# Patient Record
Sex: Female | Born: 1970 | Race: Black or African American | Hispanic: No | Marital: Married | Smoking: Current every day smoker
Health system: Southern US, Community
[De-identification: ages and names within clinical notes are randomized; demographics above are authoritative.]

## PROBLEM LIST (undated history)

## (undated) DIAGNOSIS — I219 Acute myocardial infarction, unspecified: Secondary | ICD-10-CM

## (undated) DIAGNOSIS — I10 Essential (primary) hypertension: Secondary | ICD-10-CM

## (undated) DIAGNOSIS — E785 Hyperlipidemia, unspecified: Secondary | ICD-10-CM

## (undated) DIAGNOSIS — I2510 Atherosclerotic heart disease of native coronary artery without angina pectoris: Secondary | ICD-10-CM

## (undated) HISTORY — PX: JOINT REPLACEMENT: SHX530

---

## 2008-01-03 ENCOUNTER — Emergency Department: Payer: Self-pay | Admitting: Emergency Medicine

## 2012-02-29 ENCOUNTER — Emergency Department (HOSPITAL_COMMUNITY): Payer: Self-pay

## 2012-02-29 ENCOUNTER — Emergency Department (HOSPITAL_COMMUNITY)
Admission: EM | Admit: 2012-02-29 | Discharge: 2012-02-29 | Disposition: A | Discharge status: Home / Self Care | Payer: Self-pay | Attending: Emergency Medicine | Admitting: Emergency Medicine

## 2012-02-29 ENCOUNTER — Encounter (HOSPITAL_COMMUNITY): Payer: Self-pay | Admitting: Emergency Medicine

## 2012-02-29 DIAGNOSIS — I252 Old myocardial infarction: Secondary | ICD-10-CM | POA: Insufficient documentation

## 2012-02-29 DIAGNOSIS — I2510 Atherosclerotic heart disease of native coronary artery without angina pectoris: Secondary | ICD-10-CM | POA: Insufficient documentation

## 2012-02-29 DIAGNOSIS — F17200 Nicotine dependence, unspecified, uncomplicated: Secondary | ICD-10-CM | POA: Insufficient documentation

## 2012-02-29 DIAGNOSIS — Y9389 Activity, other specified: Secondary | ICD-10-CM | POA: Insufficient documentation

## 2012-02-29 DIAGNOSIS — Z87828 Personal history of other (healed) physical injury and trauma: Secondary | ICD-10-CM | POA: Insufficient documentation

## 2012-02-29 DIAGNOSIS — X58XXXA Exposure to other specified factors, initial encounter: Secondary | ICD-10-CM | POA: Insufficient documentation

## 2012-02-29 DIAGNOSIS — Z8679 Personal history of other diseases of the circulatory system: Secondary | ICD-10-CM | POA: Insufficient documentation

## 2012-02-29 DIAGNOSIS — S46909A Unspecified injury of unspecified muscle, fascia and tendon at shoulder and upper arm level, unspecified arm, initial encounter: Secondary | ICD-10-CM | POA: Insufficient documentation

## 2012-02-29 DIAGNOSIS — S4992XA Unspecified injury of left shoulder and upper arm, initial encounter: Secondary | ICD-10-CM

## 2012-02-29 DIAGNOSIS — Y929 Unspecified place or not applicable: Secondary | ICD-10-CM | POA: Insufficient documentation

## 2012-02-29 DIAGNOSIS — S4980XA Other specified injuries of shoulder and upper arm, unspecified arm, initial encounter: Secondary | ICD-10-CM | POA: Insufficient documentation

## 2012-02-29 HISTORY — DX: Acute myocardial infarction, unspecified: I21.9

## 2012-02-29 HISTORY — DX: Atherosclerotic heart disease of native coronary artery without angina pectoris: I25.10

## 2012-02-29 MED ORDER — OXYCODONE-ACETAMINOPHEN 5-325 MG PO TABS: 1.0000 | ORAL_TABLET | ORAL | Status: DC | PRN

## 2012-02-29 MED ORDER — OXYCODONE-ACETAMINOPHEN 5-325 MG PO TABS
1.0000 | ORAL_TABLET | Freq: Once | ORAL | Status: AC
  Administered 2012-02-29: 1 via ORAL
  Filled 2012-02-29: qty 1

## 2012-02-29 NOTE — Progress Notes (Signed)
Orthopedic Tech Progress Note Patient Details:  Carrie Winters 15-Dec-1970 [id number]  Ortho Devices Type of Ortho Device: Arm foam sling Ortho Device/Splint Location: LEFT ARM SLING Ortho Device/Splint Interventions: Application   Cammer, Mickie Bail 02/29/2012, 6:04 PM

## 2012-02-29 NOTE — ED Notes (Signed)
Patient transported to X-ray 

## 2012-02-29 NOTE — ED Notes (Signed)
Ortho notified

## 2012-02-29 NOTE — ED Notes (Signed)
Discharge instructions reviewed. Pt verbalized understanding.  

## 2012-02-29 NOTE — ED Notes (Signed)
Pt c/o of left shoulder pain that started after she was playing with her little cousins. She states she took ibuprofen earlier this morning and has gotten no relief. She states she had surgery on her rotator cuff in that shoulder in 1995.
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
## 2012-03-09 ENCOUNTER — Emergency Department: Payer: Self-pay | Admitting: Emergency Medicine

## 2012-03-09 LAB — URINALYSIS, COMPLETE
Nitrite: NEGATIVE
Ph: 6 (ref 4.5–8.0)
Protein: 30

## 2012-03-27 ENCOUNTER — Emergency Department: Payer: Self-pay | Admitting: Emergency Medicine

## 2012-03-27 LAB — URINALYSIS, COMPLETE
Bilirubin,UR: NEGATIVE
Leukocyte Esterase: NEGATIVE
Nitrite: NEGATIVE
Ph: 7 (ref 4.5–8.0)
Protein: NEGATIVE
RBC,UR: 16 /HPF (ref 0–5)

## 2012-03-27 LAB — CBC
Platelet: 365 10*3/uL (ref 150–440)
RDW: 18.6 % — ABNORMAL HIGH (ref 11.5–14.5)
WBC: 20.6 10*3/uL — ABNORMAL HIGH (ref 3.6–11.0)

## 2012-03-28 LAB — COMPREHENSIVE METABOLIC PANEL
Albumin: 3.5 g/dL (ref 3.4–5.0)
Anion Gap: 11 (ref 7–16)
BUN: 12 mg/dL (ref 7–18)
Chloride: 103 mmol/L (ref 98–107)
EGFR (African American): >60
Glucose: 88 mg/dL (ref 65–99)
Osmolality: 277 (ref 275–301)
Potassium: 3 mmol/L — ABNORMAL LOW (ref 3.5–5.1)
Sodium: 139 mmol/L (ref 136–145)
Total Protein: 7.5 g/dL (ref 6.4–8.2)

## 2012-04-05 ENCOUNTER — Emergency Department: Payer: Self-pay | Admitting: Emergency Medicine

## 2013-01-19 ENCOUNTER — Ambulatory Visit: Payer: Self-pay | Admitting: Family Medicine
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
## 2015-05-04 ENCOUNTER — Observation Stay
Admission: EM | Admit: 2015-05-04 | Discharge: 2015-05-04 | Disposition: A | Discharge status: Home / Self Care | Payer: Managed Care, Other (non HMO) | Attending: Specialist | Admitting: Specialist

## 2015-05-04 ENCOUNTER — Emergency Department: Payer: Managed Care, Other (non HMO)

## 2015-05-04 DIAGNOSIS — N920 Excessive and frequent menstruation with regular cycle: Secondary | ICD-10-CM | POA: Insufficient documentation

## 2015-05-04 DIAGNOSIS — Z8249 Family history of ischemic heart disease and other diseases of the circulatory system: Secondary | ICD-10-CM | POA: Diagnosis not present

## 2015-05-04 DIAGNOSIS — Z96612 Presence of left artificial shoulder joint: Secondary | ICD-10-CM | POA: Insufficient documentation

## 2015-05-04 DIAGNOSIS — D649 Anemia, unspecified: Secondary | ICD-10-CM | POA: Diagnosis present

## 2015-05-04 DIAGNOSIS — Z809 Family history of malignant neoplasm, unspecified: Secondary | ICD-10-CM | POA: Insufficient documentation

## 2015-05-04 DIAGNOSIS — I2510 Atherosclerotic heart disease of native coronary artery without angina pectoris: Secondary | ICD-10-CM

## 2015-05-04 DIAGNOSIS — I214 Non-ST elevation (NSTEMI) myocardial infarction: Secondary | ICD-10-CM | POA: Diagnosis present

## 2015-05-04 DIAGNOSIS — D509 Iron deficiency anemia, unspecified: Secondary | ICD-10-CM | POA: Diagnosis not present

## 2015-05-04 DIAGNOSIS — E782 Mixed hyperlipidemia: Secondary | ICD-10-CM | POA: Insufficient documentation

## 2015-05-04 DIAGNOSIS — I252 Old myocardial infarction: Secondary | ICD-10-CM | POA: Diagnosis not present

## 2015-05-04 DIAGNOSIS — F17210 Nicotine dependence, cigarettes, uncomplicated: Secondary | ICD-10-CM | POA: Diagnosis not present

## 2015-05-04 DIAGNOSIS — Z791 Long term (current) use of non-steroidal anti-inflammatories (NSAID): Secondary | ICD-10-CM | POA: Diagnosis not present

## 2015-05-04 DIAGNOSIS — R079 Chest pain, unspecified: Principal | ICD-10-CM | POA: Diagnosis present

## 2015-05-04 DIAGNOSIS — Z8679 Personal history of other diseases of the circulatory system: Secondary | ICD-10-CM | POA: Insufficient documentation

## 2015-05-04 DIAGNOSIS — R748 Abnormal levels of other serum enzymes: Secondary | ICD-10-CM | POA: Insufficient documentation

## 2015-05-04 DIAGNOSIS — J449 Chronic obstructive pulmonary disease, unspecified: Secondary | ICD-10-CM | POA: Diagnosis not present

## 2015-05-04 DIAGNOSIS — I10 Essential (primary) hypertension: Secondary | ICD-10-CM | POA: Diagnosis not present

## 2015-05-04 DIAGNOSIS — R0602 Shortness of breath: Secondary | ICD-10-CM | POA: Diagnosis not present

## 2015-05-04 HISTORY — DX: Essential (primary) hypertension: I10

## 2015-05-04 HISTORY — DX: Hyperlipidemia, unspecified: E78.5

## 2015-05-04 LAB — CBC
HCT: 22.1 % — ABNORMAL LOW (ref 35.0–47.0)
HEMATOCRIT: 24.6 % — AB (ref 35.0–47.0)
HEMOGLOBIN: 7.3 g/dL — AB (ref 12.0–16.0)
HEMOGLOBIN: 6.5 g/dL — AB (ref 12.0–16.0)
MCH: 18.7 pg — ABNORMAL LOW (ref 26.0–34.0)
MCH: 18.8 pg — AB (ref 26.0–34.0)
MCHC: 29.5 g/dL — ABNORMAL LOW (ref 32.0–36.0)
MCHC: 29.4 g/dL — AB (ref 32.0–36.0)
MCV: 63.3 fL — ABNORMAL LOW (ref 80.0–100.0)
MCV: 63.9 fL — ABNORMAL LOW (ref 80.0–100.0)
PLATELETS: 237 10*3/uL (ref 150–440)
Platelets: 247 10*3/uL (ref 150–440)
RBC: 3.88 MIL/uL (ref 3.80–5.20)
RBC: 3.47 MIL/uL — ABNORMAL LOW (ref 3.80–5.20)
RDW: 16.9 % — AB (ref 11.5–14.5)
RDW: 16.6 % — ABNORMAL HIGH (ref 11.5–14.5)
WBC: 14.4 10*3/uL — AB (ref 3.6–11.0)
WBC: 12.7 10*3/uL — ABNORMAL HIGH (ref 3.6–11.0)

## 2015-05-04 LAB — BASIC METABOLIC PANEL
Anion gap: 4 — ABNORMAL LOW (ref 5–15)
BUN: 14 mg/dL (ref 6–20)
CALCIUM: 8.8 mg/dL — AB (ref 8.9–10.3)
CO2: 28 mmol/L (ref 22–32)
CREATININE: 0.61 mg/dL (ref 0.44–1.00)
Chloride: 107 mmol/L (ref 101–111)
GFR calc Af Amer: >60 mL/min (ref >60)
GFR calc non Af Amer: >60 mL/min (ref >60)
GLUCOSE: 100 mg/dL — AB (ref 65–99)
Potassium: 3.6 mmol/L (ref 3.5–5.1)
Sodium: 139 mmol/L (ref 135–145)

## 2015-05-04 LAB — COMPREHENSIVE METABOLIC PANEL
ALBUMIN: 4 g/dL (ref 3.5–5.0)
ALK PHOS: 63 U/L (ref 38–126)
ALT: 12 U/L — ABNORMAL LOW (ref 14–54)
ANION GAP: 8 (ref 5–15)
AST: 15 U/L (ref 15–41)
BUN: 14 mg/dL (ref 6–20)
CALCIUM: 9.2 mg/dL (ref 8.9–10.3)
CHLORIDE: 104 mmol/L (ref 101–111)
CO2: 27 mmol/L (ref 22–32)
Creatinine, Ser: 0.67 mg/dL (ref 0.44–1.00)
GFR calc Af Amer: >60 mL/min (ref >60)
GFR calc non Af Amer: >60 mL/min (ref >60)
GLUCOSE: 123 mg/dL — AB (ref 65–99)
Potassium: 3.4 mmol/L — ABNORMAL LOW (ref 3.5–5.1)
SODIUM: 139 mmol/L (ref 135–145)
Total Bilirubin: 0.5 mg/dL (ref 0.3–1.2)
Total Protein: 7.6 g/dL (ref 6.5–8.1)

## 2015-05-04 LAB — LIPID PANEL
CHOL/HDL RATIO: 5.3 ratio
CHOLESTEROL: 206 mg/dL — AB (ref 0–200)
HDL: 39 mg/dL — AB (ref >40)
LDL Cholesterol: 149 mg/dL — ABNORMAL HIGH (ref 0–99)
TRIGLYCERIDES: 92 mg/dL (ref <150)
VLDL: 18 mg/dL (ref 0–40)

## 2015-05-04 LAB — TROPONIN I
TROPONIN I: 0.6 ng/mL — AB (ref <0.031)
TROPONIN I: 0.58 ng/mL — AB (ref <0.031)
Troponin I: 0.05 ng/mL — ABNORMAL HIGH (ref <0.031)

## 2015-05-04 LAB — IRON AND TIBC
Iron: 9 ug/dL — ABNORMAL LOW (ref 28–170)
SATURATION RATIOS: 2 % — AB (ref 10.4–31.8)
TIBC: 554 ug/dL — AB (ref 250–450)
UIBC: 545 ug/dL

## 2015-05-04 LAB — VITAMIN B12: Vitamin B-12: 278 pg/mL (ref 180–914)

## 2015-05-04 LAB — FOLATE: Folate: 7.4 ng/mL (ref >5.9)

## 2015-05-04 LAB — HEMOGLOBIN AND HEMATOCRIT, BLOOD
HEMATOCRIT: 27.3 % — AB (ref 35.0–47.0)
HEMATOCRIT: 31.6 % — AB (ref 35.0–47.0)
HEMOGLOBIN: 8.2 g/dL — AB (ref 12.0–16.0)
Hemoglobin: 9.7 g/dL — ABNORMAL LOW (ref 12.0–16.0)

## 2015-05-04 LAB — GLUCOSE, CAPILLARY: GLUCOSE-CAPILLARY: 90 mg/dL (ref 65–99)

## 2015-05-04 LAB — PREPARE RBC (CROSSMATCH)

## 2015-05-04 LAB — RETICULOCYTES
RBC.: 3.8 MIL/uL (ref 3.80–5.20)
RETIC CT PCT: 2 % (ref 0.4–3.1)
Retic Count, Absolute: 76 10*3/uL (ref 19.0–183.0)

## 2015-05-04 LAB — ABO/RH: ABO/RH(D): AB POS

## 2015-05-04 LAB — FERRITIN: Ferritin: 2 ng/mL — ABNORMAL LOW (ref 11–307)

## 2015-05-04 MED ORDER — NITROGLYCERIN 2 % TD OINT: 1.0000 [in_us] | TOPICAL_OINTMENT | Freq: Four times a day (QID) | TRANSDERMAL | Status: DC

## 2015-05-04 MED ORDER — SODIUM CHLORIDE 0.9 % IV SOLN: Freq: Once | INTRAVENOUS | Status: DC

## 2015-05-04 MED ORDER — ENOXAPARIN SODIUM 80 MG/0.8ML ~~LOC~~ SOLN
1.0000 mg/kg | Freq: Once | SUBCUTANEOUS | Status: AC
  Administered 2015-05-04: 75 mg via SUBCUTANEOUS

## 2015-05-04 MED ORDER — SODIUM CHLORIDE 0.9% FLUSH
3.0000 mL | Freq: Two times a day (BID) | INTRAVENOUS | Status: DC
  Administered 2015-05-04: 3 mL via INTRAVENOUS

## 2015-05-04 MED ORDER — CARVEDILOL 3.125 MG PO TABS: 3.1250 mg | ORAL_TABLET | Freq: Two times a day (BID) | ORAL | Status: DC

## 2015-05-04 MED ORDER — ENOXAPARIN SODIUM 100 MG/ML ~~LOC~~ SOLN
SUBCUTANEOUS | Status: AC
  Filled 2015-05-04: qty 1

## 2015-05-04 MED ORDER — PANTOPRAZOLE SODIUM 40 MG IV SOLR
40.0000 mg | INTRAVENOUS | Status: DC
  Administered 2015-05-04: 40 mg via INTRAVENOUS
  Filled 2015-05-04: qty 40

## 2015-05-04 MED ORDER — FERROUS SULFATE 325 (65 FE) MG PO TABS: 325.0000 mg | ORAL_TABLET | Freq: Two times a day (BID) | ORAL | Status: DC

## 2015-05-04 MED ORDER — IPRATROPIUM-ALBUTEROL 0.5-2.5 (3) MG/3ML IN SOLN: 3.0000 mL | RESPIRATORY_TRACT | Status: DC | PRN

## 2015-05-04 MED ORDER — SODIUM CHLORIDE 0.9 % IV SOLN
10.0000 mL/h | Freq: Once | INTRAVENOUS | Status: AC
  Administered 2015-05-04: 10 mL/h via INTRAVENOUS

## 2015-05-04 MED ORDER — ACETAMINOPHEN 325 MG PO TABS
650.0000 mg | ORAL_TABLET | Freq: Four times a day (QID) | ORAL | Status: DC | PRN
  Filled 2015-05-04: qty 2

## 2015-05-04 MED ORDER — ASPIRIN EC 325 MG PO TBEC
325.0000 mg | DELAYED_RELEASE_TABLET | Freq: Every day | ORAL | Status: DC
Start: 2015-05-04 — End: 2015-05-04
  Administered 2015-05-04: 325 mg via ORAL
  Filled 2015-05-04: qty 1

## 2015-05-04 NOTE — ED Notes (Signed)
Discussed patient preference on blood transfusions with patient. Pt reports she will accept a blood transfusion if one is needed for her care.

## 2015-05-04 NOTE — Consult Note (Signed)
[facility] Card[facility] Consultation Note  Patient ID: Carrie Winters, MRN: [id number], DOB/AGE: [birth year] 45 y.o. Admit date: 05/04/2015   Date of Consult: 05/04/2015 Primary Physician: No primary care provider on file. Primary Cardiologist: None  Chief Complaint:  Chief Complaint  Patient presents with  . Chest Pain   Reason for Consult: elevated troponin with known cardiovascular disease and chest discomfort  HPI: 45 y.o. female with the known apparent coronary atherosclerosis in the past with essential hypertension and mixed hyperlipidemia and chronic anemia with new onset chest discomfort while on her porch with a very high intensity chest discomfort radiating into her back associated with shortness of breath and currently fully relieved. The patient was seen in the emergency room with an EKG showing normal sinus rhythm. Normal EKG. The patient also was found to have severe anemia likely exacerbating her chest discomfort and shortness of breath with a minimal elevation of troponin of 0.6 more consistent with demand ischemia rather than acute coronary syndrome. Ration has previously been on hypertension control with carvedilol and stable. Currently the patient is resting well now getting appropriate treatment of her including packed red blood cells  Past Medical History  Diagnosis Date  . Coronary artery disease   . MI (myocardial infarction) (HCC)   . Hypertension     previous hx; not currently symptomatic  . Hyperlipidemia     previously diagnosed; not currently being treated      Surgical History:  Past Surgical History  Procedure Laterality Date  . Joint replacement      rotator cuff, left arm  . Cesarean section       Home Meds: Prior to Admission medications   Medication Sig Start Date End Date Taking? Authorizing Provider  ibuprofen (ADVIL,MOTRIN) 200 MG tablet Take 200 mg by mouth every 6 (six) hours as needed for fever, headache or mild pain. For shoulder pain    Yes Historical Provider, MD  oxyCODONE-acetaminophen (PERCOCET/ROXICET) 5-325 MG per tablet Take 1 tablet by mouth every 4 (four) hours as needed for pain. Patient not taking: Reported on 05/04/2015 02/29/12   Carleene Cooper, MD    Inpatient Medications:  . aspirin EC  325 mg Oral Daily  . carvedilol  3.125 mg Oral BID WC  . nitroGLYCERIN  1 inch Topical 4 times per day  . pantoprazole (PROTONIX) IV  40 mg Intravenous Q24H  . sodium chloride flush  3 mL Intravenous Q12H      Allergies: No Known Allergies  Social History   Social History  . Marital Status: Married    Spouse Name: N/A  . Number of Children: N/A  . Years of Education: N/A   Occupational History  . Not on file.   Social History Main Topics  . Smoking status: Current Every Day Smoker -- 1.00 packs/day    Types: Cigarettes  . Smokeless tobacco: Not on file  . Alcohol Use: Yes  . Drug Use: Not on file  . Sexual Activity: Yes    Birth Control/ Protection: Surgical   Other Topics Concern  . Not on file   Social History Narrative     Family History  Problem Relation Age of Onset  . Cancer Mother   . Migraines Mother   . Heart failure Mother      Review of Systems Positive for chest discomfort Negative for: General:  chills, fever, night sweats or weight changes.  Cardiovascular: PND orthopnea syncope dizziness  Dermatological skin lesions rashes Respiratory: Cough congestion Urologic: Frequent  urination urination at night and hematuria Abdominal: negative for nausea, vomiting, diarrhea, bright red blood per rectum, melena, or hematemesis Neurologic: negative for visual changes, and/or hearing changes  All other systems reviewed and are otherwise negative except as noted above.  Labs:  Recent Labs  05/04/15 0117 05/04/15 0511  TROPONINI 0.05* 0.60*   Lab Results  Component Value Date   WBC 12.7* 05/04/2015   HGB 6.5* 05/04/2015   HCT 22.1* 05/04/2015   MCV 63.9* 05/04/2015   PLT 237  05/04/2015    Recent Labs Lab 05/04/15 0117 05/04/15 0511  NA 139 139  K 3.4* 3.6  CL 104 107  CO2 27 28  BUN 14 14  CREATININE 0.67 0.61  CALCIUM 9.2 8.8*  PROT 7.6  --   BILITOT 0.5  --   ALKPHOS 63  --   ALT 12*  --   AST 15  --   GLUCOSE 123* 100*   Lab Results  Component Value Date   CHOL 206* 05/04/2015   HDL 39* 05/04/2015   LDLCALC 149* 05/04/2015   TRIG 92 05/04/2015   No results found for: DDIMER  Radiology/Studies:  Dg Chest Port 1 View  05/04/2015  CLINICAL DATA:  Acute onset of generalized chest pain and shortness of breath. Initial encounter. EXAM: PORTABLE CHEST 1 VIEW COMPARISON:  Chest radiograph performed 10/29/2013 FINDINGS: The lungs are well-aerated and clear. There is no evidence of focal opacification, pleural effusion or pneumothorax. The cardiomediastinal silhouette is within normal limits. No acute osseous abnormalities are seen. IMPRESSION: No acute cardiopulmonary process seen. Electronically Signed   By: Roanna Raider M.D.   On: 05/04/2015 02:08    EKG: Normal sinus rhythm. Normal EKG  Weights: Filed Weights   05/04/15 0127 05/04/15 0436  Weight: 160 lb (72.576 kg) 208 lb 11.2 oz (94.666 kg)     Physical Exam: Blood pressure 102/58, pulse 59, temperature 97.6 F (36.4 C), temperature source Oral, resp. rate 16, height  (1.6 m), weight 208 lb 11.2 oz (94.666 kg), last menstrual period 04/27/2015, SpO2 99 %. Body mass index is 36.98 kg/(m^2). General: Well developed, well nourished, in no acute distress. Head eyes ears nose throat: Normocephalic, atraumatic, sclera non-icteric, no xanthomas, nares are without discharge. No apparent thyromegaly and/or mass  Lungs: Normal respiratory effort.  no wheezes, no rales, no rhonchi.  Heart: RRR with normal S1 S2. no murmur gallop, no rub, PMI is normal size and placement, carotid upstroke normal without bruit, jugular venous pressure is normal Abdomen: Soft, non-tender, non-distended with  normoactive bowel sounds. No hepatomegaly. No rebound/guarding. No obvious abdominal masses. Abdominal aorta is normal size without bruit Extremities: No edema. no cyanosis, no clubbing, no ulcers  Peripheral : 2+ bilateral upper extremity pulses, 2+ bilateral femoral pulses, 2+ bilateral dorsal pedal pulse Neuro: Alert and oriented. No facial asymmetry. No focal deficit. Moves all extremities spontaneously. Musculoskeletal: Normal muscle tone without kyphosis Psych:  Responds to questions appropriately with a normal affect.    Assessment: 45 year old female with known history of coronary atherosclerosis essential hypertension and mixed hyperlipidemia with acute onset of chest pain atypical in nature likely secondary to anemia with elevated troponin more consistent with demand ischemia rather than acute coronary syndrome  Plan: 1. Continue treatment of anemia with packed red blood cells 2. Ambulation and follow for any further significant symptoms including cardiovascular symptoms and further treatment options as needed 3. Continue carvedilol for cardiovascular disease risk reduction in hypertension control 4. Further consideration of high  intensity cholesterol therapy 5. No further cardiac diagnostics necessary at this time  Signed, Lamar Blinks M.D. [facility] 05/04/2015, 7:22 AM

## 2015-05-04 NOTE — Progress Notes (Signed)
Pt more alert but still tired. Ambulating safely, does not want bed alarm. Refusing non-slip socks. Educated on safety.

## 2015-05-04 NOTE — ED Provider Notes (Addendum)
[facility] Emergency Department Provider Note     Time seen: ----------------------------------------- 1:15 AM on 05/04/2015 -----------------------------------------    I have reviewed the triage vital signs and the nursing notes.   HISTORY  Chief Complaint No chief complaint on file.    HPI Carrie Winters is a 45 y.o. female who presents ER for sudden onset of chest pain about an hour ago. Patient states she was at home, had sudden chest pain that spread across her chest and down her left arm with associated nausea and she got hot. Patient does have a history of MI, similar symptoms occurred about 12 years ago with MI. Has a noted history of coronary artery disease, received nitroglycerin in route which seemed to alleviate her pain.   Past Medical History  Diagnosis Date  . Coronary artery disease   . MI (myocardial infarction)     There are no active problems to display for this patient.   No past surgical history on file.  Allergies Review of patient's allergies indicates no known allergies.  Social History Social History  Substance Use Topics  . Smoking status: Current Every Day Smoker  . Smokeless tobacco: Not on file  . Alcohol Use: Yes    Review of Systems Constitutional: Negative for fever. Eyes: Negative for visual changes. ENT: Negative for sore throat. Cardiovascular: Positive for chest pain Respiratory: Negative for shortness of breath. Gastrointestinal: Negative for abdominal pain, vomiting and diarrhea. Positive for nausea Genitourinary: Negative for dysuria. Musculoskeletal: Negative for back pain. Skin: Negative for rash. Neurological: Negative for headaches, focal weakness or numbness.  10-point ROS otherwise negative.  ____________________________________________   PHYSICAL EXAM:  VITAL SIGNS: ED Triage Vitals  Enc Vitals Group     BP --      Pulse --      Resp --      Temp --      Temp src --       SpO2 --      Weight --      Height --      Head Cir --      Peak Flow --      Pain Score --      Pain Loc --      Pain Edu? --      Excl. in GC? --     Constitutional: Alert and oriented. Well appearing and in no distress. Eyes: Conjunctivae are normal. PERRL. Normal extraocular movements. ENT   Head: Normocephalic and atraumatic.   Nose: No congestion/rhinnorhea.   Mouth/Throat: Mucous membranes are moist.   Neck: No stridor. Cardiovascular: Normal rate, regular rhythm. Normal and symmetric distal pulses are present in all extremities. No murmurs, rubs, or gallops. Respiratory: Normal respiratory effort without tachypnea nor retractions. Breath sounds are clear and equal bilaterally. No wheezes/rales/rhonchi. Gastrointestinal: Soft and nontender. No distention. No abdominal bruits.  Musculoskeletal: Nontender with normal range of motion in all extremities. No joint effusions.  No lower extremity tenderness nor edema. Neurologic:  Normal speech and language. No gross focal neurologic deficits are appreciated. Speech is normal.  Skin:  Skin is warm, dry and intact. No rash noted. Psychiatric: Mood and affect are normal. Speech and behavior are normal. Patient exhibits appropriate insight and judgment. ____________________________________________  EKG: Interpreted by me. Normal sinus rhythm rate of 81 bpm, normal PR interval, normal QRS, normal QT interval. Flat T waves  ____________________________________________  ED COURSE:  Pertinent labs & imaging results that were available during my care  of the patient were reviewed by me and considered in my medical decision making (see chart for details). Patient is no acute distress, likely with unstable angina. Patient would likely benefit from observation and serial troponins at the initial ones negative. ____________________________________________    LABS (pertinent positives/negatives)  Labs Reviewed  CBC - Abnormal;  Notable for the following:    WBC 14.4 (*)    Hemoglobin 7.3 (*)    HCT 24.6 (*)    MCV 63.3 (*)    MCH 18.7 (*)    MCHC 29.5 (*)    RDW 16.9 (*)    All other components within normal limits  TROPONIN I - Abnormal; Notable for the following:    Troponin I 0.05 (*)    All other components within normal limits  COMPREHENSIVE METABOLIC PANEL - Abnormal; Notable for the following:    Potassium 3.4 (*)    Glucose, Bld 123 (*)    ALT 12 (*)    All other components within normal limits  URINE DRUG SCREEN, QUALITATIVE ([facility] ONLY)   CRITICAL CARE Performed by: Emily Filbert   Total critical care time: 30 minutes  Critical care time was exclusive of separately billable procedures and treating other patients.  Critical care was necessary to treat or prevent imminent or life-threatening deterioration.  Critical care was time spent personally by me on the following activities: development of treatment plan with patient and/or surrogate as well as nursing, discussions with consultants, evaluation of patient's response to treatment, examination of patient, obtaining history from patient or surrogate, ordering and performing treatments and interventions, ordering and review of laboratory studies, ordering and review of radiographic studies, pulse oximetry and re-evaluation of patient's condition.   RADIOLOGY Images were viewed by me  Chest x-ray IMPRESSION: No acute cardiopulmonary process seen.  ____________________________________________  FINAL ASSESSMENT AND PLAN  Chest pain, anemia, elevated troponin  Plan: Patient with labs and imaging as dictated above. Patient with a known history of iron deficiency anemia. She is noncompliant with iron. Patient with symptoms and labs consistent with a non-ST elevation MI. Patient be given Lovenox, she will likely need a blood transfusion and I have ordered her a unit of type specific blood. She is stable for admission to the  hospital.   Emily Filbert, MD   Emily Filbert, MD 05/04/15 1610  Emily Filbert, MD 05/04/15 [phone]

## 2015-05-04 NOTE — Progress Notes (Signed)
MD notified for clarification on HGB and blood orders.  MD, Dr. Sheryle Hail said to give 1 unit of blood- wait for day shift before giving more since pt has hx of anemia- last hgb documented in December was 8.5.  No need for heparin drip at this time- wait for second troponin. Carrie Winters

## 2015-05-04 NOTE — Progress Notes (Addendum)
Patient given discharge teaching and paperwork regarding medications, diet, follow-up appointments and activity. Patient understanding verbalized. No complaints at this time. IV and telemetry discontinued prior to leaving. Skin assessment as previously charted and vitals are stable; on room air. Patient being discharged to home.  Prescription printed and given to patient. Patient did not wait for wheelchair, walked out with family.

## 2015-05-04 NOTE — H&P (Addendum)
PCP:   None   Chief Complaint:  Chest pain  HPI: This is a 45 year old female with history of coronary artery disease from over 10 years prior. Today she was on her deck smoking a cigarette when she developed the sudden onset of left-sided chest pain. It radiated down her left arm and lasted all tissues in the ER and received Nitropaste. She was nauseous and diaphoretic. Chest pain was described as heavy/dull and pressure. At its worse it was 10/10, currently 0/10. She was lightheaded. As stated she has a history of coronary artery disease but she takes no medications. She said she has not had chest pain since her prior MI but this pain was similar. She has a history of iron deficiency anemia, she is not on iron supplementations. She reports menorrhagia but denies any blood in her stools, stating she has brown stool. She denies a history of heartburn. In the ER her hemoglobin was 7.3. The hospitalists have been asked to admit the patient. History provided by the patient.  Review of Systems:  The patient denies anorexia, fever, weight loss, vision loss, decreased hearing, hoarseness, chest pain, syncope, dyspnea on exertion, peripheral edema, balance deficits, hemoptysis, abdominal pain, melena, hematochezia, severe indigestion/heartburn, hematuria, incontinence, genital sores, muscle weakness, suspicious skin lesions, transient blindness, difficulty walking, depression, unusual weight change, abnormal bleeding, enlarged lymph nodes, angioedema, and breast masses.  Past Medical History: Past Medical History  Diagnosis Date  . Coronary artery disease   . MI (myocardial infarction) (HCC)   . Hypertension     previous hx; not currently symptomatic  . Hyperlipidemia     previously diagnosed; not currently being treated   Past Surgical History  Procedure Laterality Date  . Joint replacement      rotator cuff, left arm  . Cesarean section      Medications: Prior to Admission medications    Medication Sig Start Date End Date Taking? Authorizing Provider  ibuprofen (ADVIL,MOTRIN) 200 MG tablet Take 200 mg by mouth every 6 (six) hours as needed for fever, headache or mild pain. For shoulder pain   Yes Historical Provider, MD  oxyCODONE-acetaminophen (PERCOCET/ROXICET) 5-325 MG per tablet Take 1 tablet by mouth every 4 (four) hours as needed for pain. Patient not taking: Reported on 05/04/2015 02/29/12   Carleene Cooper, MD    Allergies:  No Known Allergies  Social History:  reports that she has been smoking Cigarettes.  She has been smoking about 1.00 pack per day. She does not have any smokeless tobacco history on file. She reports that she drinks alcohol. Her drug history is not on file.  Family History: Family History  Problem Relation Age of Onset  . Cancer Mother   . Migraines Mother   . Heart failure Mother     Physical Exam: Filed Vitals:   05/04/15 0117 05/04/15 0127 05/04/15 0130 05/04/15 0200  BP:  124/64 116/65 114/63  Pulse:  76 81 85  Temp:  98.3 F (36.8 C)    TempSrc:  Oral    Resp:  Height:   (1.6 m)    Weight:  72.576 kg (160 lb)    SpO2: 98% 100% 100% 100%    General:  Alert and oriented times three, well developed and nourished, no acute distress Eyes: PERRLA, pink conjunctiva, no scleral icterus ENT: Moist oral mucosa, neck supple, no thyromegaly Lungs: clear to ascultation, no wheeze, no crackles, no use of accessory muscles Cardiovascular: regular rate and rhythm, no  regurgitation, no gallops, no murmurs. No carotid bruits, no JVD, no reproducible chest wall tenderness Abdomen: soft, positive BS, non-tender, non-distended, no organomegaly, not an acute abdomen GU: not examined Neuro: CN II - XII grossly intact, sensation intact Musculoskeletal: strength 5/5 all extremities, no clubbing, cyanosis or edema Skin: no rash, no subcutaneous crepitation, no decubitus Psych: appropriate patient   Labs on Admission:   Recent  Labs  05/04/15 0117  NA 139  K 3.4*  CL 104  CO2 27  GLUCOSE 123*  BUN 14  CREATININE 0.67  CALCIUM 9.2    Recent Labs  05/04/15 0117  AST 15  ALT 12*  ALKPHOS 63  BILITOT 0.5  PROT 7.6  ALBUMIN 4.0   No results for input(s): LIPASE, AMYLASE in the last 72 hours.  Recent Labs  05/04/15 0117  WBC 14.4*  HGB 7.3*  HCT 24.6*  MCV 63.3*  PLT 247    Recent Labs  05/04/15 0117  TROPONINI 0.05*   Invalid input(s): POCBNP No results for input(s): DDIMER in the last 72 hours. No results for input(s): HGBA1C in the last 72 hours. No results for input(s): CHOL, HDL, LDLCALC, TRIG, CHOLHDL, LDLDIRECT in the last 72 hours. No results for input(s): TSH, T4TOTAL, T3FREE, THYROIDAB in the last 72 hours.  Invalid input(s): FREET3 No results for input(s): VITAMINB12, FOLATE, FERRITIN, TIBC, IRON, RETICCTPCT in the last 72 hours.  Micro Results: No results found for this or any previous visit (from the past 240 hour(s)).   Radiological Exams on Admission: Dg Chest Port 1 View  05/04/2015  CLINICAL DATA:  Acute onset of generalized chest pain and shortness of breath. Initial encounter. EXAM: PORTABLE CHEST 1 VIEW COMPARISON:  Chest radiograph performed 10/29/2013 FINDINGS: The lungs are well-aerated and clear. There is no evidence of focal opacification, pleural effusion or pneumothorax. The cardiomediastinal silhouette is within normal limits. No acute osseous abnormalities are seen. IMPRESSION: No acute cardiopulmonary process seen. Electronically Signed   By: Roanna Raider M.D.   On: 05/04/2015 02:08   EKG: NSR  Assessment/Plan Present on Admission:  . Chest pain/possible NSTEMI CAD -admit to ICU -ASA daily -order heparin gtt in AM, patient received Lovenox in ER or continue lovenox if no evidence of bleeding. -cycle cardiac enzymes, lipid panel in AM  -coreg with hold parameters, nitropaste ordered -plavix not given d/t patients anemia. -Cardiology consult  requested Symptomatic anemia -maybe the cause of patients chest pain -transfuse to hemoglobin >10, three units PRBC ordered -protonix ordered -occult stool ordered Tobacco abuse COPD -duonebs as needed - Menorrhagia -f/u with OB/Gyn   Carrie Winters 05/04/2015, 2:37 AM

## 2015-05-04 NOTE — Progress Notes (Signed)
Ok for pt to have 650 PO tylenol PRN Q6h per Dr. Cherlynn Kaiser

## 2015-05-04 NOTE — ED Notes (Addendum)
Pt informed urine sample was needed. Pt currently unable to give specimen. Pt reported she would alert nurse when she was able to give sample.

## 2015-05-04 NOTE — Progress Notes (Signed)
Dr. Cherlynn Kaiser aware of Hgb post-transfusion. Pt saying she has 1/10 chest dullness/pressure, no interventions at this time. Vitals and telemetry stable. Instructed to discharge patient and encourage her to find a PCP to start seeing regularly.

## 2015-05-04 NOTE — Progress Notes (Addendum)
Dr. Cherlynn Kaiser has rounded. Per MD, give second unit of blood. Recheck H&H 30 minutes after transfusion completed.

## 2015-05-04 NOTE — ED Notes (Signed)
TROP I 0.05

## 2015-05-04 NOTE — ED Notes (Signed)
Per EMS: Pt c/o 10 out of 10 chest pain central and left chest radiating down left arm. 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 Pt was given 1" nitropaste, 22 sublingual nitroglycerins 324 mg aspirin, and 50 mL of NaCl.  Pt currently denies any chest pain. Pt reports during chest pain episode she experienced nausea, lightheadedness/dizzness, diaphoresis, weakness, and SOB. Pt denies LOC. Pt describes the pain as having been crushing/heaviness/dull/pressure.

## 2015-05-04 NOTE — ED Notes (Signed)
Discussed pt's Nitro paste on left chest with MD Mayford Knife. MD ordered it to be left on patient.

## 2015-05-04 NOTE — ED Notes (Signed)
MD Williams at bedside.  

## 2015-05-04 NOTE — Progress Notes (Signed)
Pt very lethargic once arrived from ED.  Pt stayed awake during admission questions but did not respond with much more than yes or no.  Pt was given verbal education on blood products- consent was signed in ER.  1st unit initiated during shift.  No further concerns at this time.  Bed alarm in place for first ambulation- especially with IV pole connected. Carrie Winters

## 2015-05-04 NOTE — ED Notes (Signed)
Admitting MD at bedside.

## 2015-05-05 LAB — TYPE AND SCREEN
ABO/RH(D): AB POS
ANTIBODY SCREEN: NEGATIVE
UNIT DIVISION: 0
Unit division: 0
Unit division: 0

## 2015-05-05 LAB — PREPARE RBC (CROSSMATCH)

## 2015-05-05 NOTE — Discharge Summary (Signed)
[facility] [place] at [facility]   PATIENT NAME: Carrie Winters    MR#:  [id number]  DATE OF BIRTH:  05/08/1970  DATE OF ADMISSION:  05/04/2015 ADMITTING PHYSICIAN: Gery Pray, MD  DATE OF DISCHARGE: 05/04/2015  4:38 PM  PRIMARY CARE PHYSICIAN: No primary care provider on file.    ADMISSION DIAGNOSIS:  NSTEMI (non-ST elevated myocardial infarction) (HCC) [I21.4] Chest pain, unspecified chest pain type [R07.9] Anemia, unspecified anemia type [D64.9]  DISCHARGE DIAGNOSIS:  Principal Problem:   Chest pain Active Problems:   CAD (coronary artery disease)   Anemia   NSTEMI (non-ST elevated myocardial infarction) (HCC)   SECONDARY DIAGNOSIS:   Past Medical History  Diagnosis Date  . Coronary artery disease   . MI (myocardial infarction) (HCC)   . Hypertension     previous hx; not currently symptomatic  . Hyperlipidemia     previously diagnosed; not currently being treated    HOSPITAL COURSE:   45 year old female with past medical history of coronary artery disease, chronic anemia who presented to the hospital with chest pain and a mildly elevated troponin.  #1 chest pain-this was likely secondary to demand ischemia from her significant anemia. -She was observed overnight on telemetry had 3 sets of cardiac markers checked which were not significantly elevated. After getting blood transfusion and her hemoglobin improving she denies any chest pain and shortness of breath and therefore was discharged home.  #2 symptomatic anemia-this was the cause of patient's chest pain and shortness of breath. Patient has underlying baseline anemia with hemoglobin of around 8.5. She presented to the hospital with a hemoglobin of 6.5. She was transfused a total of 2 units of packed red blood cells and her hemoglobin posttransfusion is 9.7 and she feels better and therefore being discharged home. -Her iron studies were significantly abnormal and therefore she was  discharged on oral iron. She denied any evidence of acute bleeding but this really needs to be worked up as an outpatient. -She was told to stop taking ibuprofen regularly but she denies any acute melena or hematochezia.  DISCHARGE CONDITIONS:   Stable  CONSULTS OBTAINED:  Treatment Team:  Lamar Blinks, MD  DRUG ALLERGIES:  No Known Allergies  DISCHARGE MEDICATIONS:   Discharge Medication List as of 05/04/2015  4:02 PM    START taking these medications   Details  ferrous sulfate 325 (65 FE) MG tablet Take 1 tablet (325 mg total) by mouth 2 (two) times daily with a meal., Starting 05/04/2015, Until Discontinued, Print      CONTINUE these medications which have NOT CHANGED   Details  oxyCODONE-acetaminophen (PERCOCET/ROXICET) 5-325 MG per tablet Take 1 tablet by mouth every 4 (four) hours as needed for pain., Starting 02/29/2012, Until Discontinued, Print      STOP taking these medications     ibuprofen (ADVIL,MOTRIN) 200 MG tablet          DISCHARGE INSTRUCTIONS:   DIET:  Regular diet  DISCHARGE CONDITION:  Stable  ACTIVITY:  Activity as tolerated  OXYGEN:  Home Oxygen: No.   Oxygen Delivery: room air  DISCHARGE LOCATION:  home   If you experience worsening of your admission symptoms, develop shortness of breath, life threatening emergency, suicidal or homicidal thoughts you must seek medical attention immediately by calling 911 or calling your MD immediately  if symptoms less severe.  You Must read complete instructions/literature along with all the possible adverse reactions/side effects for all the Medicines you take and that have  been prescribed to you. Take any new Medicines after you have completely understood and accpet all the possible adverse reactions/side effects.   Please note  You were cared for by a hospitalist during your hospital stay. If you have any questions about your discharge medications or the care you received while you were in the  hospital after you are discharged, you can call the unit and asked to speak with the hospitalist on call if the hospitalist that took care of you is not available. Once you are discharged, your primary care physician will handle any further medical issues. Please note that NO REFILLS for any discharge medications will be authorized once you are discharged, as it is imperative that you return to your primary care physician (or establish a relationship with a primary care physician if you do not have one) for your aftercare needs so that they can reassess your need for medications and monitor your lab values.     Today   No chest pain, abdominal pain, nausea, vomiting. No melena, hematochezia.  VITAL SIGNS:  Blood pressure 106/50, pulse 65, temperature 98.9 F (37.2 C), temperature source Oral, resp. rate 16, height  (1.6 m), weight 94.666 kg (208 lb 11.2 oz), last menstrual period 04/27/2015, SpO2 99 %.  I/O:  No intake or output data in the 24 hours ending 05/05/15 1605  PHYSICAL EXAMINATION:  GENERAL:  45 y.o.-year-old patient lying in the bed with no acute distress.  EYES: Pupils equal, round, reactive to light and accommodation. No scleral icterus. Extraocular muscles intact.  HEENT: Head atraumatic, normocephalic. Oropharynx and nasopharynx clear.  NECK:  Supple, no jugular venous distention. No thyroid enlargement, no tenderness.  LUNGS: Normal breath sounds bilaterally, no wheezing, rales,rhonchi. No use of accessory muscles of respiration.  CARDIOVASCULAR: S1, S2 normal. No murmurs, rubs, or gallops.  ABDOMEN: Soft, non-tender, non-distended. Bowel sounds present. No organomegaly or mass.  EXTREMITIES: No pedal edema, cyanosis, or clubbing.  NEUROLOGIC: Cranial nerves II through XII are intact. No focal motor or sensory defecits b/l.  PSYCHIATRIC: The patient is alert and oriented x 3. Good affect.  SKIN: No obvious rash, lesion, or ulcer.   DATA REVIEW:   CBC  Recent  Labs Lab 05/04/15 0511  05/04/15 1514  WBC 12.7*  --   --   HGB 6.5*  < > 9.7*  HCT 22.1*  < > 31.6*  PLT 237  --   --   < > = values in this interval not displayed.  Chemistries   Recent Labs Lab 05/04/15 0117 05/04/15 0511  NA 139 139  K 3.4* 3.6  CL 104 107  CO2 27 28  GLUCOSE 123* 100*  BUN 14 14  CREATININE 0.67 0.61  CALCIUM 9.2 8.8*  AST 15  --   ALT 12*  --   ALKPHOS 63  --   BILITOT 0.5  --     Cardiac Enzymes  Recent Labs Lab 05/04/15 1022  TROPONINI 0.58*    Microbiology Results  No results found for this or any previous visit.  RADIOLOGY:  Dg Chest Port 1 View  05/04/2015  CLINICAL DATA:  Acute onset of generalized chest pain and shortness of breath. Initial encounter. EXAM: PORTABLE CHEST 1 VIEW COMPARISON:  Chest radiograph performed 10/29/2013 FINDINGS: The lungs are well-aerated and clear. There is no evidence of focal opacification, pleural effusion or pneumothorax. The cardiomediastinal silhouette is within normal limits. No acute osseous abnormalities are seen. IMPRESSION: No acute cardiopulmonary process seen. Electronically  Signed   By: Roanna Raider M.D.   On: 05/04/2015 02:08      Management plans discussed with the patient, family and they are in agreement.  CODE STATUS:  Code Status History    Date Active Date Inactive Code Status Order ID Comments User Context   05/04/2015  4:32 AM 05/04/2015  7:38 PM Full Code [id number]  Gery Pray, MD Inpatient      TOTAL TIME TAKING CARE OF THIS PATIENT: 40 minutes.    Houston Siren M.D on 05/05/2015 at 4:05 PM  Between 7am to 6pm - Pager - [phone]  After 6pm go to [email or web address] - password [email or web address] [facility]  [place]  Office  [phone]  CC: Primary care physician; No primary care provider on file.

## 2016-12-10 ENCOUNTER — Emergency Department: Payer: Self-pay

## 2016-12-10 ENCOUNTER — Other Ambulatory Visit: Payer: Self-pay

## 2016-12-10 ENCOUNTER — Emergency Department
Admission: EM | Admit: 2016-12-10 | Discharge: 2016-12-10 | Disposition: A | Discharge status: Home / Self Care | Payer: Self-pay | Attending: Emergency Medicine | Admitting: Emergency Medicine

## 2016-12-10 ENCOUNTER — Encounter: Payer: Self-pay | Admitting: Emergency Medicine

## 2016-12-10 DIAGNOSIS — R0789 Other chest pain: Secondary | ICD-10-CM | POA: Insufficient documentation

## 2016-12-10 DIAGNOSIS — I252 Old myocardial infarction: Secondary | ICD-10-CM | POA: Insufficient documentation

## 2016-12-10 DIAGNOSIS — I10 Essential (primary) hypertension: Secondary | ICD-10-CM | POA: Insufficient documentation

## 2016-12-10 DIAGNOSIS — I2510 Atherosclerotic heart disease of native coronary artery without angina pectoris: Secondary | ICD-10-CM | POA: Insufficient documentation

## 2016-12-10 DIAGNOSIS — F17210 Nicotine dependence, cigarettes, uncomplicated: Secondary | ICD-10-CM | POA: Insufficient documentation

## 2016-12-10 DIAGNOSIS — J069 Acute upper respiratory infection, unspecified: Secondary | ICD-10-CM | POA: Insufficient documentation

## 2016-12-10 DIAGNOSIS — Z96612 Presence of left artificial shoulder joint: Secondary | ICD-10-CM | POA: Insufficient documentation

## 2016-12-10 DIAGNOSIS — R1110 Vomiting, unspecified: Secondary | ICD-10-CM | POA: Insufficient documentation

## 2016-12-10 LAB — CBC
HCT: 36.6 % (ref 35.0–47.0)
Hemoglobin: 12 g/dL (ref 12.0–16.0)
MCH: 26.2 pg (ref 26.0–34.0)
MCHC: 32.7 g/dL (ref 32.0–36.0)
MCV: 80.1 fL (ref 80.0–100.0)
PLATELETS: 478 10*3/uL — AB (ref 150–440)
RBC: 4.56 MIL/uL (ref 3.80–5.20)
RDW: 17.6 % — AB (ref 11.5–14.5)
WBC: 16.8 10*3/uL — ABNORMAL HIGH (ref 3.6–11.0)

## 2016-12-10 LAB — BASIC METABOLIC PANEL
Anion gap: 11 (ref 5–15)
BUN: 7 mg/dL (ref 6–20)
CALCIUM: 9.6 mg/dL (ref 8.9–10.3)
CHLORIDE: 103 mmol/L (ref 101–111)
CO2: 25 mmol/L (ref 22–32)
CREATININE: 0.66 mg/dL (ref 0.44–1.00)
GFR calc non Af Amer: >60 mL/min (ref >60)
Glucose, Bld: 102 mg/dL — ABNORMAL HIGH (ref 65–99)
Potassium: 3.8 mmol/L (ref 3.5–5.1)
SODIUM: 139 mmol/L (ref 135–145)

## 2016-12-10 LAB — TROPONIN I: Troponin I: <0.03 ng/mL (ref <0.03)

## 2016-12-10 LAB — GLUCOSE, CAPILLARY: Glucose-Capillary: 94 mg/dL (ref 65–99)

## 2016-12-10 LAB — FIBRIN DERIVATIVES D-DIMER (ARMC ONLY): Fibrin derivatives D-dimer (ARMC): 661.93 — ABNORMAL HIGH (ref 0.00–499.00)

## 2016-12-10 MED ORDER — IOPAMIDOL (ISOVUE-370) INJECTION 76%
75.0000 mL | Freq: Once | INTRAVENOUS | Status: AC | PRN
  Administered 2016-12-10: 75 mL via INTRAVENOUS
  Filled 2016-12-10: qty 75

## 2016-12-10 MED ORDER — HYDROCOD POLST-CPM POLST ER 10-8 MG/5ML PO SUER: 5.0000 mL | Freq: Two times a day (BID) | ORAL | 0 refills | Status: DC

## 2016-12-10 MED ORDER — ONDANSETRON HCL 4 MG/2ML IJ SOLN
4.0000 mg | Freq: Once | INTRAMUSCULAR | Status: AC | PRN
  Administered 2016-12-10: 4 mg via INTRAVENOUS
  Filled 2016-12-10: qty 2

## 2016-12-10 MED ORDER — HYDROCOD POLST-CPM POLST ER 10-8 MG/5ML PO SUER
5.0000 mL | Freq: Once | ORAL | Status: AC
  Administered 2016-12-10: 5 mL via ORAL
  Filled 2016-12-10: qty 5

## 2016-12-10 NOTE — ED Provider Notes (Signed)
[facility] Emergency Department Provider Note       Time seen: ----------------------------------------- 2:47 PM on 12/10/2016 -----------------------------------------     I have reviewed the triage vital signs and the nursing notes.   HISTORY   Chief Complaint Chest Pain and Emesis    HPI Carrie Winters is a 46 y.o. female who presents to the ED for left-sided nonradiating chest pain with vomiting that began this morning. Patient reports recent symptoms of URI with cough. Patient presents vomiting with dry heaving in triage. Patient reports the pain as stabbing and is made somewhat worse with breathing. 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 Pain is 2 out of 10 in her chest currently.   Past Medical History:  Diagnosis Date  . Coronary artery disease   . Hyperlipidemia    previously diagnosed; not currently being treated  . Hypertension    previous hx; not currently symptomatic  . MI (myocardial infarction) [facility](HCC)     Patient Active Problem List   Diagnosis Date Noted  . CAD (coronary artery disease) 05/04/2015  . Chest pain 05/04/2015  . Anemia 05/04/2015  . NSTEMI (non-ST elevated myocardial infarction) (HCC) 05/04/2015    Past Surgical History:  Procedure Laterality Date  . CESAREAN SECTION    . JOINT REPLACEMENT     rotator cuff, left arm    Allergies Patient has no known allergies.  Social History Social History  Substance Use Topics  . Smoking status: Current Every Day Smoker    Packs/day: 1.00    Types: Cigarettes  . Smokeless tobacco: Not on file  . Alcohol use Yes    Review of Systems Constitutional: Negative for fever. Eyes: Negative for vision changes ENT: positive for congestion and cough Cardiovascular: Negative for chest pain. Respiratory: Negative for shortness of breath. Gastrointestinal: Negative for abdominal pain, positive for vomiting Genitourinary:  Negative for dysuria. Musculoskeletal: Negative for back pain. Skin: Negative for rash. Neurological: Negative for headaches, focal weakness or numbness.  All systems negative/normal/unremarkable except as stated in the HPI  ____________________________________________   PHYSICAL EXAM:  VITAL SIGNS: ED Triage Vitals  Enc Vitals Group     BP 12/10/16 1130 (!) 132/92     Pulse Rate 12/10/16 1130 (!) 103     Resp 12/10/16 1130 (!) 24     Temp 12/10/16 1130 99.1 F (37.3 C)     Temp Source 12/10/16 1130 Oral     SpO2 12/10/16 1130 98 %     Weight 12/10/16 1123 185 lb (83.9 kg)     Height 12/10/16 1123 5\' 3"  (1.6 m)     Head Circumference --      Peak Flow --      Pain Score 12/10/16 1123 3     Pain Loc --      Pain Edu? --      Excl. in GC? --     Constitutional: Alert and oriented. Well appearing and in no distress. Eyes: Conjunctivae are normal. Normal extraocular movements. ENT   Head: Normocephalic and atraumatic.   Nose: No congestion/rhinnorhea.   Mouth/Throat: Mucous membranes are moist.   Neck: No stridor. Cardiovascular: Normal rate, regular rhythm. No murmurs, rubs, or gallops. Respiratory: Normal respiratory effort without tachypnea nor retractions. Breath sounds are clear and equal bilaterally. No wheezes/rales/rhonchi. Gastrointestinal: Soft and nontender. Normal bowel sounds Musculoskeletal: Nontender with normal range of motion in extremities. No lower extremity tenderness nor edema. Neurologic:  Normal speech  and language. No gross focal neurologic deficits are appreciated.  Skin:  Skin is warm, dry and intact. No rash noted. Psychiatric: Mood and affect are normal. Speech and behavior are normal.  ____________________________________________  EKG: Interpreted by me.sinus tachycardia with a rate of 111 bpm, LVH, normal QT  ____________________________________________  ED COURSE:  Pertinent labs & imaging results that were available during  my care of the patient were reviewed by me and considered in my medical decision making (see chart for details). Patient presents for chest pain with vomiting and cough, we will assess with labs and imaging as indicated.   Procedures ____________________________________________   LABS (pertinent positives/negatives)  Labs Reviewed  BASIC METABOLIC PANEL - Abnormal; Notable for the following:       Result Value   Glucose, Bld 102 (*)    All other components within normal limits  CBC - Abnormal; Notable for the following:    WBC 16.8 (*)    RDW 17.6 (*)    Platelets 478 (*)    All other components within normal limits  FIBRIN DERIVATIVES D-DIMER ([facility] ONLY) - Abnormal; Notable for the following:    Fibrin derivatives D-dimer [facility]) 661.93 (*)    All other components within normal limits  TROPONIN I  GLUCOSE, CAPILLARY  TROPONIN I    RADIOLOGY  chest x-ray is normal CT angiogram of the chest IMPRESSION: No evidence of pulmonary embolism or other acute findings.  Tiny hiatal hernia.  ____________________________________________  FINAL ASSESSMENT AND PLAN  chest pain, cough, vomiting   Plan: Patient's labs and imaging were dictated above. Patient had presented for left-sided chest pain with vomiting and cough.d-dimer was elevated therefore a CT angiogram was performed which was negative for PE. She has a leukocytosis which appears chronic but otherwise her testing has been normal with repeat troponin. She'll be discharged with cough medicine and advised to have close outpatient follow-up.   Emily Filbert, MD   Note: This note was generated in part or whole with voice recognition software. Voice recognition is usually quite accurate but there are transcription errors that can and very often do occur. I apologize for any typographical errors that were not detected and corrected.     Emily Filbert, MD 12/10/16 1640

## 2016-12-10 NOTE — ED Notes (Signed)
Patient returned from radiology

## 2016-12-10 NOTE — ED Triage Notes (Signed)
Pt reports left side non radiating chest pain with vomiting that began this morning. Pt reports recent symptoms of URI with cough. Pt heaving and vomiting in triage. Pt reports chest pain as stabbing. Reports history of MI.

## 2018-12-07 ENCOUNTER — Other Ambulatory Visit: Payer: Self-pay

## 2018-12-07 ENCOUNTER — Encounter: Payer: Self-pay | Admitting: *Deleted

## 2018-12-07 ENCOUNTER — Emergency Department: Payer: Self-pay

## 2018-12-07 ENCOUNTER — Observation Stay
Admission: EM | Admit: 2018-12-07 | Discharge: 2018-12-08 | Disposition: A | Discharge status: Home / Self Care | Payer: Self-pay | Attending: Internal Medicine | Admitting: Internal Medicine

## 2018-12-07 DIAGNOSIS — R079 Chest pain, unspecified: Secondary | ICD-10-CM

## 2018-12-07 DIAGNOSIS — E785 Hyperlipidemia, unspecified: Secondary | ICD-10-CM | POA: Insufficient documentation

## 2018-12-07 DIAGNOSIS — F17210 Nicotine dependence, cigarettes, uncomplicated: Secondary | ICD-10-CM | POA: Insufficient documentation

## 2018-12-07 DIAGNOSIS — Z20828 Contact with and (suspected) exposure to other viral communicable diseases: Secondary | ICD-10-CM | POA: Insufficient documentation

## 2018-12-07 DIAGNOSIS — I10 Essential (primary) hypertension: Secondary | ICD-10-CM | POA: Insufficient documentation

## 2018-12-07 DIAGNOSIS — R0789 Other chest pain: Principal | ICD-10-CM | POA: Insufficient documentation

## 2018-12-07 DIAGNOSIS — I252 Old myocardial infarction: Secondary | ICD-10-CM | POA: Insufficient documentation

## 2018-12-07 DIAGNOSIS — Z8249 Family history of ischemic heart disease and other diseases of the circulatory system: Secondary | ICD-10-CM | POA: Insufficient documentation

## 2018-12-07 DIAGNOSIS — I2510 Atherosclerotic heart disease of native coronary artery without angina pectoris: Secondary | ICD-10-CM | POA: Insufficient documentation

## 2018-12-07 LAB — BASIC METABOLIC PANEL
Anion gap: 9 (ref 5–15)
BUN: 13 mg/dL (ref 6–20)
CO2: 24 mmol/L (ref 22–32)
Calcium: 9.3 mg/dL (ref 8.9–10.3)
Chloride: 106 mmol/L (ref 98–111)
Creatinine, Ser: 0.57 mg/dL (ref 0.44–1.00)
GFR calc Af Amer: >60 mL/min (ref >60)
GFR calc non Af Amer: >60 mL/min (ref >60)
Glucose, Bld: 106 mg/dL — ABNORMAL HIGH (ref 70–99)
Potassium: 4 mmol/L (ref 3.5–5.1)
Sodium: 139 mmol/L (ref 135–145)

## 2018-12-07 LAB — HEPATIC FUNCTION PANEL
ALT: 23 U/L (ref 0–44)
AST: 19 U/L (ref 15–41)
Albumin: 3 g/dL — ABNORMAL LOW (ref 3.5–5.0)
Alkaline Phosphatase: 54 U/L (ref 38–126)
Bilirubin, Direct: <0.1 mg/dL (ref 0.0–0.2)
Total Bilirubin: 0.1 mg/dL — ABNORMAL LOW (ref 0.3–1.2)
Total Protein: 6 g/dL — ABNORMAL LOW (ref 6.5–8.1)

## 2018-12-07 LAB — CBC
HCT: 39.3 % (ref 36.0–46.0)
Hemoglobin: 12.2 g/dL (ref 12.0–15.0)
MCH: 24.5 pg — ABNORMAL LOW (ref 26.0–34.0)
MCHC: 31 g/dL (ref 30.0–36.0)
MCV: 78.9 fL — ABNORMAL LOW (ref 80.0–100.0)
Platelets: 291 10*3/uL (ref 150–400)
RBC: 4.98 MIL/uL (ref 3.87–5.11)
RDW: 22.6 % — ABNORMAL HIGH (ref 11.5–15.5)
WBC: 12.7 10*3/uL — ABNORMAL HIGH (ref 4.0–10.5)
nRBC: 0 % (ref 0.0–0.2)

## 2018-12-07 LAB — SARS CORONAVIRUS 2 BY RT PCR (HOSPITAL ORDER, PERFORMED IN ~~LOC~~ HOSPITAL LAB): SARS Coronavirus 2: NEGATIVE

## 2018-12-07 LAB — TROPONIN I (HIGH SENSITIVITY)
Troponin I (High Sensitivity): 17 ng/L (ref <18)
Troponin I (High Sensitivity): 24 ng/L — ABNORMAL HIGH (ref <18)

## 2018-12-07 MED ORDER — LIDOCAINE VISCOUS HCL 2 % MT SOLN
15.0000 mL | Freq: Once | OROMUCOSAL | Status: AC
  Administered 2018-12-07: 15 mL via ORAL
  Filled 2018-12-07: qty 15

## 2018-12-07 MED ORDER — ACETAMINOPHEN 500 MG PO TABS
1000.0000 mg | ORAL_TABLET | Freq: Once | ORAL | Status: AC
  Administered 2018-12-07: 1000 mg via ORAL
  Filled 2018-12-07: qty 2

## 2018-12-07 MED ORDER — IBUPROFEN 600 MG PO TABS
600.0000 mg | ORAL_TABLET | Freq: Once | ORAL | Status: AC
  Administered 2018-12-07: 20:00:00 600 mg via ORAL
  Filled 2018-12-07: qty 1

## 2018-12-07 MED ORDER — SODIUM CHLORIDE 0.9% FLUSH: 3.0000 mL | Freq: Once | INTRAVENOUS | Status: DC

## 2018-12-07 MED ORDER — ONDANSETRON HCL 4 MG/2ML IJ SOLN
4.0000 mg | Freq: Once | INTRAMUSCULAR | Status: DC
  Filled 2018-12-07: qty 2

## 2018-12-07 MED ORDER — ALUM & MAG HYDROXIDE-SIMETH 200-200-20 MG/5ML PO SUSP
30.0000 mL | Freq: Once | ORAL | Status: AC
  Administered 2018-12-07: 30 mL via ORAL
  Filled 2018-12-07: qty 30

## 2018-12-07 MED ORDER — ASPIRIN 81 MG PO CHEW
324.0000 mg | CHEWABLE_TABLET | Freq: Once | ORAL | Status: AC
  Administered 2018-12-07: 23:00:00 324 mg via ORAL
  Filled 2018-12-07: qty 4

## 2018-12-07 MED ORDER — MORPHINE SULFATE (PF) 4 MG/ML IV SOLN
4.0000 mg | INTRAVENOUS | Status: DC | PRN
  Filled 2018-12-07: qty 1

## 2018-12-07 NOTE — ED Notes (Signed)
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  Pt began having pressure in the chest, weakness and diff breathing and left arm numbness..  Pt took 2 tums without relief.   On arrival to er, no chest pain.  No n/v/d  nsr on monitor.  Pt alert.  Iv in place.

## 2018-12-07 NOTE — ED Notes (Signed)
No chest pain  Pt alert.  nsr on monitor

## 2018-12-07 NOTE — ED Notes (Signed)
ED Provider at bedside. 

## 2018-12-07 NOTE — ED Provider Notes (Signed)
[facility] Emergency Department Provider Note    First MD Initiated Contact with Patient 12/07/18 1740     (approximate)  I have reviewed the triage vital signs and the nursing notes.   HISTORY  Chief Complaint Chest Pain    HPI Carrie Winters is a 48 y.o. female below listed past medical history not any anticoagulation or antiplatelet therapy presents to the ER for evaluation of midsternal chest pain radiating to her left shoulder associated with diaphoresis that occurred around 420 and lasted several minutes.  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  She was bathing her.  States that there was associated shortness of breath.  Initially felt like this was her heartburn but it became more severe.  She took 2 Tums without much improvement.  EMS found the patient by the time the EMS had arrived her pain had resolved.  Denies any abdominal pain nausea or vomiting.  No back pain.    Past Medical History:  Diagnosis Date  . Coronary artery disease   . Hyperlipidemia    previously diagnosed; not currently being treated  . Hypertension    previous hx; not currently symptomatic  . MI (myocardial infarction) (HCC)    Family History  Problem Relation Age of Onset  . Cancer Mother   . Migraines Mother   . Heart failure Mother    Past Surgical History:  Procedure Laterality Date  . CESAREAN SECTION    . JOINT REPLACEMENT     rotator cuff, left arm   Patient Active Problem List   Diagnosis Date Noted  . CAD (coronary artery disease) 05/04/2015  . Chest pain 05/04/2015  . Anemia 05/04/2015  . NSTEMI (non-ST elevated myocardial infarction) (HCC) 05/04/2015      Prior to Admission medications   Not on File    Allergies Patient has no known allergies.    Social History Social History   Tobacco Use  . Smoking status: Current Every Day Smoker    Packs/day: 1.00    Types: Cigarettes  . Smokeless tobacco: Never Used   Substance Use Topics  . Alcohol use: Not Currently  . Drug use: Not Currently    Review of Systems Patient denies headaches, rhinorrhea, blurry vision, numbness, shortness of breath, chest pain, edema, cough, abdominal pain, nausea, vomiting, diarrhea, dysuria, fevers, rashes or hallucinations unless otherwise stated above in HPI. ____________________________________________   PHYSICAL EXAM:  VITAL SIGNS: Vitals:   12/07/18 2100 12/07/18 2238  BP: 110/66 114/73  Pulse: (!) 55 87  Resp: 14 14  Temp:    SpO2: 94% 96%    Constitutional: Alert and oriented.  Eyes: Conjunctivae are normal.  Head: Atraumatic. Nose: No congestion/rhinnorhea. Mouth/Throat: Mucous membranes are moist.   Neck: No stridor. Painless ROM.  Cardiovascular: Normal rate, regular rhythm. Grossly normal heart sounds.  Good peripheral circulation. Respiratory: Normal respiratory effort.  No retractions. Lungs CTAB. Gastrointestinal: Soft and nontender. No distention. No abdominal bruits. No CVA tenderness. Genitourinary:  Musculoskeletal: No lower extremity tenderness nor edema.  No joint effusions. Neurologic:  Normal speech and language. No gross focal neurologic deficits are appreciated. No facial droop Skin:  Skin is warm, dry and intact. No rash noted. Psychiatric: Mood and affect are normal. Speech and behavior are normal.  ____________________________________________   LABS (all labs ordered are listed, but only abnormal results are displayed)  Results for orders placed or performed during the hospital encounter of 12/07/18 (from the past  24 hour(s))  CBC     Status: Abnormal   Collection Time: 12/07/18  5:42 PM  Result Value Ref Range   WBC 12.7 (H) 4.0 - 10.5 K/uL   RBC 4.98 3.87 - 5.11 MIL/uL   Hemoglobin 12.2 12.0 - 15.0 g/dL   HCT 16.139.3 09.636.0 - 04.546.0 %   MCV 78.9 (L) 80.0 - 100.0 fL   MCH 24.5 (L) 26.0 - 34.0 pg   MCHC 31.0 30.0 - 36.0 g/dL   RDW 40.922.6 (H) 81.111.5 - 91.415.5 %   Platelets 291  150 - 400 K/uL   nRBC 0.0 0.0 - 0.2 %  Basic metabolic panel     Status: Abnormal   Collection Time: 12/07/18  7:20 PM  Result Value Ref Range   Sodium 139 135 - 145 mmol/L   Potassium 4.0 3.5 - 5.1 mmol/L   Chloride 106 98 - 111 mmol/L   CO2 24 22 - 32 mmol/L   Glucose, Bld 106 (H) 70 - 99 mg/dL   BUN 13 6 - 20 mg/dL   Creatinine, Ser 7.820.57 0.44 - 1.00 mg/dL   Calcium 9.3 8.9 - 95.610.3 mg/dL   GFR calc non Af Amer >60 >60 mL/min   GFR calc Af Amer >60 >60 mL/min   Anion gap 9 5 - 15  Troponin I (High Sensitivity)     Status: None   Collection Time: 12/07/18  7:20 PM  Result Value Ref Range   Troponin I (High Sensitivity) 17 <18 ng/L  Troponin I (High Sensitivity)     Status: Abnormal   Collection Time: 12/07/18  8:10 PM  Result Value Ref Range   Troponin I (High Sensitivity) 24 (H) <18 ng/L  Hepatic function panel     Status: Abnormal   Collection Time: 12/07/18  8:10 PM  Result Value Ref Range   Total Protein 6.0 (L) 6.5 - 8.1 g/dL   Albumin 3.0 (L) 3.5 - 5.0 g/dL   AST 19 15 - 41 U/L   ALT 23 0 - 44 U/L   Alkaline Phosphatase 54 38 - 126 U/L   Total Bilirubin 0.1 (L) 0.3 - 1.2 mg/dL   Bilirubin, Direct <2.1<0.1 0.0 - 0.2 mg/dL   Indirect Bilirubin NOT CALCULATED 0.3 - 0.9 mg/dL  SARS Coronavirus 2 [facility](Hospital order, Performed in [facility] hospital lab) Nasopharyngeal Nasopharyngeal Swab     Status: None   Collection Time: 12/07/18  9:08 PM   Specimen: Nasopharyngeal Swab  Result Value Ref Range   SARS Coronavirus 2 NEGATIVE NEGATIVE   ____________________________________________  EKG My review and personal interpretation at Time: 17:47   Indication: chest pain  Rate: 80  Rhythm: sinus Axis: normal Other: normal intervals, no stemi ____________________________________________  RADIOLOGY  I personally reviewed all radiographic images ordered to evaluate for the above acute complaints and reviewed radiology reports and findings.  These findings were personally discussed  with the patient.  Please see medical record for radiology report.  ____________________________________________   PROCEDURES  Procedure(s) performed:  Procedures    Critical Care performed: no ____________________________________________   INITIAL IMPRESSION / ASSESSMENT AND PLAN / ED COURSE  Pertinent labs & imaging results that were available during my care of the patient were reviewed by me and considered in my medical decision making (see chart for details).   DDX: ACS, pericarditis, esophagitis, boerhaaves, pe, dissection, pna, bronchitis, costochondritis   Carrie Winters is a 48 y.o. who presents to the ED with chest discomfort as descrbied above.  Patient  is AFVSS in ED. Exam as above. Given current presentation have considered the above differential.  Lower suspicion for pe or dissection.  Sounds concerning for acs.  Will order enzymes.  Abdominal exam soft and benign.  Clinical Course as of Dec 06 2305  Thu Dec 07, 2018  2304 Persistent chest discomfort though mild and improved will place on heparin and aspirin will discuss with hospitalist for admission.   [PR]    Clinical Course User Index [PR] Merlyn Lot, MD    The patient was evaluated in Emergency Department today for the symptoms described in the history of present illness. He/she was evaluated in the context of the global COVID-19 pandemic, which necessitated consideration that the patient might be at risk for infection with the SARS-CoV-2 virus that causes COVID-19. Institutional protocols and algorithms that pertain to the evaluation of patients at risk for COVID-19 are in a state of rapid change based on information released by regulatory bodies including the CDC and federal and state organizations. These policies and algorithms were followed during the patient's care in the ED.  As part of my medical decision making, I reviewed the following data within the [place] notes  reviewed and incorporated, Labs reviewed, notes from prior ED visits and [place] Controlled Substance Database   ____________________________________________   FINAL CLINICAL IMPRESSION(S) / ED DIAGNOSES  Final diagnoses:  Chest pain, unspecified type      NEW MEDICATIONS STARTED DURING THIS VISIT:  New Prescriptions   No medications on file     Note:  This document was prepared using Dragon voice recognition software and may include unintentional dictation errors.    Merlyn Lot, MD 12/07/18 [phone]

## 2018-12-07 NOTE — ED Triage Notes (Signed)
Pt brought in via ems with chest pain,.   No pain on arrival to ER.  No sob now.  Pt alert. Iv in place.  324 asa given by ems

## 2018-12-07 NOTE — ED Notes (Signed)
Report off to katy rn  

## 2018-12-08 ENCOUNTER — Observation Stay
Admit: 2018-12-08 | Discharge: 2018-12-08 | Disposition: A | Discharge status: Home / Self Care | Payer: Self-pay | Attending: Internal Medicine | Admitting: Internal Medicine

## 2018-12-08 ENCOUNTER — Observation Stay: Payer: Self-pay

## 2018-12-08 ENCOUNTER — Encounter: Payer: Self-pay | Admitting: Radiology

## 2018-12-08 LAB — NM MYOCAR MULTI W/SPECT W/WALL MOTION / EF
Estimated workload: 1 METS
Exercise duration (min): 1 min
Exercise duration (sec): 47 s
LV dias vol: 131 mL (ref 46–106)
LV sys vol: 47 mL
Peak HR: 108 {beats}/min
Rest HR: 66 {beats}/min
SDS: 1
SRS: 8
SSS: 5
TID: 1.04

## 2018-12-08 LAB — CBC
HCT: 37.9 % (ref 36.0–46.0)
Hemoglobin: 11.7 g/dL — ABNORMAL LOW (ref 12.0–15.0)
MCH: 24.5 pg — ABNORMAL LOW (ref 26.0–34.0)
MCHC: 30.9 g/dL (ref 30.0–36.0)
MCV: 79.3 fL — ABNORMAL LOW (ref 80.0–100.0)
Platelets: 258 10*3/uL (ref 150–400)
RBC: 4.78 MIL/uL (ref 3.87–5.11)
RDW: 22.3 % — ABNORMAL HIGH (ref 11.5–15.5)
WBC: 9.7 10*3/uL (ref 4.0–10.5)
nRBC: 0 % (ref 0.0–0.2)

## 2018-12-08 LAB — HEPARIN LEVEL (UNFRACTIONATED): Heparin Unfractionated: <0.1 IU/mL — ABNORMAL LOW (ref 0.30–0.70)

## 2018-12-08 LAB — BASIC METABOLIC PANEL
Anion gap: 7 (ref 5–15)
BUN: 14 mg/dL (ref 6–20)
CO2: 25 mmol/L (ref 22–32)
Calcium: 8.8 mg/dL — ABNORMAL LOW (ref 8.9–10.3)
Chloride: 108 mmol/L (ref 98–111)
Creatinine, Ser: 0.61 mg/dL (ref 0.44–1.00)
GFR calc Af Amer: >60 mL/min (ref >60)
GFR calc non Af Amer: >60 mL/min (ref >60)
Glucose, Bld: 99 mg/dL (ref 70–99)
Potassium: 3.5 mmol/L (ref 3.5–5.1)
Sodium: 140 mmol/L (ref 135–145)

## 2018-12-08 LAB — PROTIME-INR
INR: 1 (ref 0.8–1.2)
Prothrombin Time: 12.9 seconds (ref 11.4–15.2)

## 2018-12-08 LAB — TROPONIN I (HIGH SENSITIVITY)
Troponin I (High Sensitivity): 11 ng/L (ref <18)
Troponin I (High Sensitivity): 8 ng/L (ref <18)

## 2018-12-08 LAB — APTT: aPTT: 30 seconds (ref 24–36)

## 2018-12-08 MED ORDER — ACETAMINOPHEN 325 MG PO TABS: 650.0000 mg | ORAL_TABLET | Freq: Four times a day (QID) | ORAL | Status: DC | PRN

## 2018-12-08 MED ORDER — HEPARIN (PORCINE) 25000 UT/250ML-% IV SOLN: 800.0000 [IU]/h | INTRAVENOUS | Status: DC

## 2018-12-08 MED ORDER — ONDANSETRON HCL 4 MG/2ML IJ SOLN: 4.0000 mg | Freq: Four times a day (QID) | INTRAMUSCULAR | Status: DC | PRN

## 2018-12-08 MED ORDER — HEPARIN BOLUS VIA INFUSION
4000.0000 [IU] | Freq: Once | INTRAVENOUS | Status: AC
  Administered 2018-12-08: 4000 [IU] via INTRAVENOUS
  Filled 2018-12-08: qty 4000

## 2018-12-08 MED ORDER — TECHNETIUM TC 99M TETROFOSMIN IV KIT
10.0000 | PACK | Freq: Once | INTRAVENOUS | Status: AC | PRN
  Administered 2018-12-08: 09:00:00 11.04 via INTRAVENOUS

## 2018-12-08 MED ORDER — MORPHINE SULFATE (PF) 4 MG/ML IV SOLN: 4.0000 mg | INTRAVENOUS | Status: DC | PRN

## 2018-12-08 MED ORDER — ONDANSETRON HCL 4 MG PO TABS: 4.0000 mg | ORAL_TABLET | Freq: Four times a day (QID) | ORAL | Status: DC | PRN

## 2018-12-08 MED ORDER — OXYCODONE HCL 5 MG PO TABS: 5.0000 mg | ORAL_TABLET | ORAL | Status: DC | PRN

## 2018-12-08 MED ORDER — TECHNETIUM TC 99M TETROFOSMIN IV KIT
32.3800 | PACK | Freq: Once | INTRAVENOUS | Status: AC | PRN
  Administered 2018-12-08: 32.38 via INTRAVENOUS

## 2018-12-08 MED ORDER — REGADENOSON 0.4 MG/5ML IV SOLN
0.4000 mg | Freq: Once | INTRAVENOUS | Status: AC
  Administered 2018-12-08: 0.4 mg via INTRAVENOUS

## 2018-12-08 MED ORDER — ACETAMINOPHEN 650 MG RE SUPP: 650.0000 mg | Freq: Four times a day (QID) | RECTAL | Status: DC | PRN

## 2018-12-08 MED ORDER — HEPARIN (PORCINE) 25000 UT/250ML-% IV SOLN
800.0000 [IU]/h | INTRAVENOUS | Status: DC
  Administered 2018-12-08: 02:00:00 800 [IU]/h via INTRAVENOUS
  Filled 2018-12-08: qty 250

## 2018-12-08 NOTE — Progress Notes (Signed)
*  PRELIMINARY RESULTS* Echocardiogram 2D Echocardiogram has been performed.  Carrie Winters 12/08/2018, 10:59 AM

## 2018-12-08 NOTE — Consult Note (Signed)
Reason for Consult: Chest pain previous coronary dissection Referring Physician: Dr. Bayard Males emergency room Dr. Oralia Manis hospitalist  Carrie Winters is an 48 y.o. female.  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  Patient was then referred for cardiac evaluation and functional study  Past Medical History:  Diagnosis Date  . Coronary artery disease   . Hyperlipidemia    previously diagnosed; not currently being treated  . Hypertension    previous hx; not currently symptomatic  . MI (myocardial infarction) [facility])     Past Surgical History:  Procedure Laterality Date  . CESAREAN SECTION    . JOINT REPLACEMENT     rotator cuff, left arm    Family History  Problem Relation Age of Onset  . Cancer Mother   . Migraines Mother   . Heart failure Mother     Social History:  reports that she has been smoking cigarettes. She has been smoking about 1.00 pack per day. She has never used smokeless tobacco. She reports previous alcohol use. She reports previous drug use.  Allergies: No Known Allergies  Medications: I have reviewed the patient's current medications.  Results for orders placed or performed during the hospital encounter of 12/07/18 (from the past 48 hour(s))  CBC     Status: Abnormal   Collection Time: 12/07/18  5:42 PM  Result Value Ref Range   WBC 12.7 (H) 4.0 - 10.5 K/uL   RBC 4.98 3.87 - 5.11 MIL/uL   Hemoglobin 12.2 12.0 - 15.0 g/dL   HCT 32.3 55.7 - 32.2 %   MCV 78.9 (L) 80.0 - 100.0 fL   MCH 24.5 (L) 26.0 - 34.0 pg   MCHC 31.0 30.0 - 36.0 g/dL   RDW 02.5 (H) 42.7 - 06.2 %   Platelets 291 150 - 400  K/uL   nRBC 0.0 0.0 - 0.2 %    Comment: Performed at [facility], [address] [place], [place] [address]  APTT     Status: None   Collection Time: 12/07/18  5:42 PM  Result Value Ref Range   aPTT 30 24 - 36 seconds    Comment: Performed at [facility], [address] [place], [place] [address]  Protime-INR     Status: None   Collection Time: 12/07/18  5:42 PM  Result Value Ref Range   Prothrombin Time 12.9 11.4 - 15.2 seconds   INR 1.0 0.8 - 1.2    Comment: (NOTE) INR goal varies based on device and disease states. Performed at [facility], [address] [place], [place] [address]   Basic metabolic panel     Status: Abnormal   Collection Time: 12/07/18  7:20 PM  Result Value Ref Range   Sodium 139 135 - 145 mmol/L   Potassium 4.0 3.5 - 5.1 mmol/L   Chloride 106 98 - 111 mmol/L   CO2 24 22 - 32 mmol/L   Glucose, Bld 106 (H) 70 - 99 mg/dL   BUN 13 6 - 20 mg/dL   Creatinine, Ser 3.71 0.44 - 1.00 mg/dL   Calcium 9.3 8.9 - 06.2 mg/dL   GFR calc non Af Amer >60 >60 mL/min  GFR calc Af Amer >60 >60 mL/min   Anion gap 9 5 - 15    Comment: Performed at [facility], [place], [place] [address]  Troponin I (High Sensitivity)     Status: None   Collection Time: 12/07/18  7:20 PM  Result Value Ref Range   Troponin I (High Sensitivity) 17 <18 ng/L    Comment: (NOTE) Elevated high sensitivity troponin I (hsTnI) values and significant  changes across serial measurements may suggest ACS but many other  chronic and acute conditions are known to elevate hsTnI results.  Refer to the "Links" section for chest pain algorithms and additional  guidance. Performed at [facility], [place], [place] [address]   Troponin I (High Sensitivity)     Status: Abnormal   Collection Time: 12/07/18  8:10 PM  Result Value Ref Range   Troponin I (High Sensitivity) 24 (H) <18 ng/L    Comment: (NOTE) Elevated high  sensitivity troponin I (hsTnI) values and significant  changes across serial measurements may suggest ACS but many other  chronic and acute conditions are known to elevate hsTnI results.  Refer to the "Links" section for chest pain algorithms and additional  guidance. Performed at [facility], [address] [place], [place] [address]   Hepatic function panel     Status: Abnormal   Collection Time: 12/07/18  8:10 PM  Result Value Ref Range   Total Protein 6.0 (L) 6.5 - 8.1 g/dL   Albumin 3.0 (L) 3.5 - 5.0 g/dL   AST 19 15 - 41 U/L   ALT 23 0 - 44 U/L   Alkaline Phosphatase 54 38 - 126 U/L   Total Bilirubin 0.1 (L) 0.3 - 1.2 mg/dL   Bilirubin, Direct <0.1 0.0 - 0.2 mg/dL   Indirect Bilirubin NOT CALCULATED 0.3 - 0.9 mg/dL    Comment: Performed at [facility], [address] [place], [place] [address]  SARS Coronavirus 2 [facility] order, Performed in [facility] hospital lab) Nasopharyngeal Nasopharyngeal Swab     Status: None   Collection Time: 12/07/18  9:08 PM   Specimen: Nasopharyngeal Swab  Result Value Ref Range   SARS Coronavirus 2 NEGATIVE NEGATIVE    Comment: (NOTE) If result is NEGATIVE SARS-CoV-2 target nucleic acids are NOT DETECTED. The SARS-CoV-2 RNA is generally detectable in upper and lower  respiratory specimens during the acute phase of infection. The lowest  concentration of SARS-CoV-2 viral copies this assay can detect is 250  copies / mL. A negative result does not preclude SARS-CoV-2 infection  and should not be used as the sole basis for treatment or other  patient management decisions.  A negative result may occur with  improper specimen collection / handling, submission of specimen other  than nasopharyngeal swab, presence of viral mutation(s) within the  areas targeted by this assay, and inadequate number of viral copies  (<250 copies / mL). A negative result must be combined with clinical  observations, patient history, and  epidemiological information. If result is POSITIVE SARS-CoV-2 target nucleic acids are DETECTED. The SARS-CoV-2 RNA is generally detectable in upper and lower  respiratory specimens dur ing the acute phase of infection.  Positive  results are indicative of active infection with SARS-CoV-2.  Clinical  correlation with patient history and other diagnostic information is  necessary to determine patient infection status.  Positive results do  not rule out bacterial infection or co-infection with other viruses. If result is  PRESUMPTIVE POSTIVE SARS-CoV-2 nucleic acids MAY BE PRESENT.   A presumptive positive result was obtained on the submitted specimen  and confirmed on repeat testing.  While 2019 novel coronavirus  (SARS-CoV-2) nucleic acids may be present in the submitted sample  additional confirmatory testing may be necessary for epidemiological  and / or clinical management purposes  to differentiate between  SARS-CoV-2 and other Sarbecovirus currently known to infect humans.  If clinically indicated additional testing with an alternate test  methodology [phone]([id number]) is advised. The SARS-CoV-2 RNA is generally  detectable in upper and lower respiratory sp ecimens during the acute  phase of infection. The expected result is Negative. Fact Sheet for Patients:  BoilerBrush.com.cyhttps://www.fda.gov/media/136312/download Fact Sheet for Healthcare Providers: https://pope.com/https://www.fda.gov/media/136313/download This test is not yet approved or cleared by the [place] FDA and has been authorized for detection and/or diagnosis of SARS-CoV-2 by FDA under an Emergency Use Authorization (EUA).  This EUA will remain in effect (meaning this test can be used) for the duration of the COVID-19 declaration under Section 564(b)(1) of the Act, 21 U.S.C. section 360bbb-3(b)(1), unless the authorization is terminated or revoked sooner. Performed at [facility], [address] [place], [place] [address]   Basic  metabolic panel     Status: Abnormal   Collection Time: 12/08/18  4:21 AM  Result Value Ref Range   Sodium 140 135 - 145 mmol/L   Potassium 3.5 3.5 - 5.1 mmol/L   Chloride 108 98 - 111 mmol/L   CO2 25 22 - 32 mmol/L   Glucose, Bld 99 70 - 99 mg/dL   BUN 14 6 - 20 mg/dL   Creatinine, Ser 8.110.61 0.44 - 1.00 mg/dL   Calcium 8.8 (L) 8.9 - 10.3 mg/dL   GFR calc non Af Amer >60 >60 mL/min   GFR calc Af Amer >60 >60 mL/min   Anion gap 7 5 - 15    Comment: Performed at [facility], [address] [place], [place] [address]  CBC     Status: Abnormal   Collection Time: 12/08/18  4:21 AM  Result Value Ref Range   WBC 9.7 4.0 - 10.5 K/uL   RBC 4.78 3.87 - 5.11 MIL/uL   Hemoglobin 11.7 (L) 12.0 - 15.0 g/dL   HCT 29.537.9 62.136.0 - 30.846.0 %   MCV 79.3 (L) 80.0 - 100.0 fL   MCH 24.5 (L) 26.0 - 34.0 pg   MCHC 30.9 30.0 - 36.0 g/dL   RDW 65.722.3 (H) 84.611.5 - 96.215.5 %   Platelets 258 150 - 400 K/uL   nRBC 0.0 0.0 - 0.2 %    Comment: Performed at [facility], [address] [place], [place] [address]  Heparin level (unfractionated)     Status: Abnormal   Collection Time: 12/08/18  8:34 AM  Result Value Ref Range   Heparin Unfractionated <0.10 (L) 0.30 - 0.70 IU/mL    Comment: (NOTE) If heparin results are below expected values, and patient dosage has  been confirmed, suggest follow up testing of antithrombin III levels. Performed at [facility], [address] [place], [place] [address]   Troponin I (High Sensitivity)     Status: None   Collection Time: 12/08/18  8:34 AM  Result Value Ref Range   Troponin I (High Sensitivity) 11 <18 ng/L    Comment: (NOTE) Elevated high sensitivity troponin I (hsTnI) values and significant  changes across serial measurements may suggest ACS but many other  chronic and acute conditions are known  to elevate hsTnI results.  Refer to the "Links" section for chest pain algorithms and additional  guidance. Performed at [facility],  [address] [place], [place] [address]   Troponin I (High Sensitivity)     Status: None   Collection Time: 12/08/18  1:41 PM  Result Value Ref Range   Troponin I (High Sensitivity) 8 <18 ng/L    Comment: (NOTE) Elevated high sensitivity troponin I (hsTnI) values and significant  changes across serial measurements may suggest ACS but many other  chronic and acute conditions are known to elevate hsTnI results.  Refer to the "Links" section for chest pain algorithms and additional  guidance. Performed at [facility], [address] [place], [place] [address]     Dg Chest 2 View  Result Date: 12/07/2018 CLINICAL DATA:  Chest pain EXAM: CHEST - 2 VIEW COMPARISON:  12/10/2016 FINDINGS: The heart size and mediastinal contours are within normal limits. Both lungs are clear. The visualized skeletal structures are unremarkable. IMPRESSION: No active cardiopulmonary disease. Electronically Signed   By: Jasmine PangKim  Fujinaga M.D.   On: 12/07/2018 19:00   Nm Myocar Multi W/spect W/wall Motion / Ef  Result Date: 12/08/2018  Blood pressure demonstrated a normal response to exercise.  There was no ST segment deviation noted during stress.  No T wave inversion was noted during stress.  This is a low risk study.  The left ventricular ejection fraction is mildly decreased (45-54%).  Nuclear stress EF: 45%.  Plan Borderline reduced LV function Ejection fraction around 45%    Review of Systems  Constitutional: Positive for malaise/fatigue.  HENT: Negative.   Eyes: Negative.   Respiratory: Positive for shortness of breath.   Cardiovascular: Positive for chest pain.  Gastrointestinal: Negative.   Genitourinary: Negative.   Musculoskeletal: Negative.   Skin: Negative.   Neurological: Positive for weakness.  Endo/Heme/Allergies: Negative.   Psychiatric/Behavioral: Negative.    Blood pressure 108/76, pulse 72, temperature 98.4 F (36.9 C), temperature source Oral, resp. rate 20, height  5\' 3"  (1.6 m), weight 117.5 kg, SpO2 100 %. Physical Exam  Nursing note and vitals reviewed. Constitutional: She is oriented to person, place, and time. She appears well-developed and well-nourished.  HENT:  Head: Normocephalic and atraumatic.  Eyes: Pupils are equal, round, and reactive to light. Conjunctivae and EOM are normal.  Neck: Normal range of motion. Neck supple.  Cardiovascular: Normal rate, regular rhythm and normal heart sounds.  Respiratory: Effort normal and breath sounds normal.  GI: Soft. Bowel sounds are normal.  Musculoskeletal: Normal range of motion.  Neurological: She is alert and oriented to person, place, and time. She has normal reflexes.  Skin: Skin is warm and dry.  Psychiatric: She has a normal mood and affect.    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 Plan Agree for rule out for myocardial infarction Follow-up cardiac enzymes and EKG Consider echocardiogram Agree with functional study Continue current home medications Follow-up with cardiology as necessary    Laiya Wisby D Ayannah Faddis 12/08/2018, 3:08 PM

## 2018-12-08 NOTE — H&P (Signed)
[facility] [place] NAME: Carrie Winters    MR#:  [id number]  DATE OF BIRTH:  October 28, 1970  DATE OF ADMISSION:  12/07/2018  PRIMARY CARE PHYSICIAN: Patient, No Pcp Per   REQUESTING/REFERRING PHYSICIAN: Quentin Cornwall, MD  CHIEF COMPLAINT:   Chief Complaint  Patient presents with  . Chest Pain    HISTORY OF PRESENT ILLNESS:  Carrie Winters  is a 48 y.o. female who presents with chief complaint as above.  Patient presents the ED with a complaint of central chest pain that began this evening and has been persistent since then, with no aggravating or alleviating factors, which radiates intermittently to her left shoulder and arm, and was associated with diaphoresis and shortness of breath.  She has a history of coronary artery disease.  Reportedly she had an abnormal stress test several years ago but did not follow-up afterwards.  Work-up in the ED initially is largely within normal limits.  However, she has persistent chest pain.  Hospitalist called for admission  PAST MEDICAL HISTORY:   Past Medical History:  Diagnosis Date  . Coronary artery disease   . Hyperlipidemia    previously diagnosed; not currently being treated  . Hypertension    previous hx; not currently symptomatic  . MI (myocardial infarction) ([place])      PAST SURGICAL HISTORY:   Past Surgical History:  Procedure Laterality Date  . CESAREAN SECTION    . JOINT REPLACEMENT     rotator cuff, left arm     SOCIAL HISTORY:   Social History   Tobacco Use  . Smoking status: Current Every Day Smoker    Packs/day: 1.00    Types: Cigarettes  . Smokeless tobacco: Never Used  Substance Use Topics  . Alcohol use: Not Currently     FAMILY HISTORY:   Family History  Problem Relation Age of Onset  . Cancer Mother   . Migraines Mother   . Heart failure Mother      DRUG ALLERGIES:  No Known Allergies  MEDICATIONS AT HOME:   Prior to Admission medications    Not on File    REVIEW OF SYSTEMS:  Review of Systems  Constitutional: Positive for diaphoresis. Negative for chills, fever, malaise/fatigue and weight loss.  HENT: Negative for ear pain, hearing loss and tinnitus.   Eyes: Negative for blurred vision, double vision, pain and redness.  Respiratory: Positive for shortness of breath. Negative for cough and hemoptysis.   Cardiovascular: Positive for chest pain. Negative for palpitations, orthopnea and leg swelling.  Gastrointestinal: Negative for abdominal pain, constipation, diarrhea, nausea and vomiting.  Genitourinary: Negative for dysuria, frequency and hematuria.  Musculoskeletal: Negative for back pain, joint pain and neck pain.  Skin:       No acne, rash, or lesions  Neurological: Negative for dizziness, tremors, focal weakness and weakness.  Endo/Heme/Allergies: Negative for polydipsia. Does not bruise/bleed easily.  Psychiatric/Behavioral: Negative for depression. The patient is not nervous/anxious and does not have insomnia.      VITAL SIGNS:   Vitals:   12/07/18 2000 12/07/18 2030 12/07/18 2100 12/07/18 2238  BP: 120/67 121/68 110/66 114/73  Pulse: 67  (!) 55 87  Resp: (!) 24 16 14 14   Temp:      TempSrc:      SpO2: 99%  94% 96%   Wt Readings from Last 3 Encounters:  12/10/16 76.2 kg  05/04/15 94.7 kg    PHYSICAL EXAMINATION:  Physical Exam  Vitals reviewed.  Constitutional: She is oriented to person, place, and time. She appears well-developed and well-nourished. No distress.  HENT:  Head: Normocephalic and atraumatic.  Mouth/Throat: Oropharynx is clear and moist.  Eyes: Pupils are equal, round, and reactive to light. Conjunctivae and EOM are normal. No scleral icterus.  Neck: Normal range of motion. Neck supple. No JVD present. No thyromegaly present.  Cardiovascular: Normal rate, regular rhythm and intact distal pulses. Exam reveals no gallop and no friction rub.  No murmur heard. Respiratory: Effort normal  and breath sounds normal. No respiratory distress. She has no wheezes. She has no rales.  GI: Soft. Bowel sounds are normal. She exhibits no distension. There is no abdominal tenderness.  Musculoskeletal: Normal range of motion.        General: No edema.     Comments: No arthritis, no gout  Lymphadenopathy:    She has no cervical adenopathy.  Neurological: She is alert and oriented to person, place, and time. No cranial nerve deficit.  No dysarthria, no aphasia  Skin: Skin is warm and dry. No rash noted. No erythema.  Psychiatric: She has a normal mood and affect. Her behavior is normal. Judgment and thought content normal.    LABORATORY PANEL:   CBC Recent Labs  Lab 12/07/18 1742  WBC 12.7*  HGB 12.2  HCT 39.3  PLT 291   ------------------------------------------------------------------------------------------------------------------  Chemistries  Recent Labs  Lab 12/07/18 1920 12/07/18 2010  NA 139  --   K 4.0  --   CL 106  --   CO2 24  --   GLUCOSE 106*  --   BUN 13  --   CREATININE 0.57  --   CALCIUM 9.3  --   AST  --  19  ALT  --  23  ALKPHOS  --  54  BILITOT  --  0.1*   ------------------------------------------------------------------------------------------------------------------  Cardiac Enzymes No results for input(s): TROPONINI in the last 168 hours. ------------------------------------------------------------------------------------------------------------------  RADIOLOGY:  Dg Chest 2 View  Result Date: 12/07/2018 CLINICAL DATA:  Chest pain EXAM: CHEST - 2 VIEW COMPARISON:  12/10/2016 FINDINGS: The heart size and mediastinal contours are within normal limits. Both lungs are clear. The visualized skeletal structures are unremarkable. IMPRESSION: No active cardiopulmonary disease. Electronically Signed   By: Jasmine Pang M.D.   On: 12/07/2018 19:00    EKG:   Orders placed or performed during the hospital encounter of 12/07/18  . EKG 12-Lead  .  EKG 12-Lead  . ED EKG within 10 minutes  . ED EKG within 10 minutes    IMPRESSION AND PLAN:  Principal Problem:   Chest pain -pain is improved, though persistent.  Troponins are okay.  EKG is nonischemic.  We will trend cardiac enzymes, get an echocardiogram and a cardiology consult Active Problems:   CAD (coronary artery disease) -continue home meds, other work-up as above   HTN (hypertension) -home dose antihypertensives   HLD (hyperlipidemia) -home dose antilipid   Chart review performed and case discussed with ED provider. Labs, imaging and/or ECG reviewed by provider and discussed with patient/family. Management plans discussed with the patient and/or family.  COVID-19 status: Tested negative     DVT PROPHYLAXIS: SubQ heparin  GI PROPHYLAXIS:  None  ADMISSION STATUS: Observation  CODE STATUS: Full Code Status History    Date Active Date Inactive Code Status Order ID Comments User Context   05/04/2015 0432 05/04/2015 1938 Full Code [id number]  Gery Pray, MD Inpatient   Advance Care Planning Activity  TOTAL TIME TAKING CARE OF THIS PATIENT: 40 minutes.   This patient was evaluated in the context of the global COVID-19 pandemic, which necessitated consideration that the patient might be at risk for infection with the SARS-CoV-2 virus that causes COVID-19. Institutional protocols and algorithms that pertain to the evaluation of patients at risk for COVID-19 are in a state of rapid change based on information released by regulatory bodies including the CDC and federal and state organizations. These policies and algorithms were followed to the best of this provider's knowledge to date during the patient's care at this facility.  Barney DrainDavid F Vaida Kerchner 12/08/2018, 12:06 AM  [facility]  Office  [phone]  CC: Primary care physician; Patient, No Pcp Per  Note:  This document was prepared using Dragon voice recognition software and may include unintentional  dictation errors.

## 2018-12-08 NOTE — Progress Notes (Signed)
Discharge patient to home. Telemetry and IV d/c. Verbalizes understanding of discharge instructions.

## 2018-12-08 NOTE — Progress Notes (Signed)
ANTICOAGULATION CONSULT NOTE - Initial Consult  Pharmacy Consult for Heparin Indication: chest pain/ACS  No Known Allergies  Patient Measurements: Height: 5\' 3"  (160 cm) Weight: 259 lb (117.5 kg) IBW/kg (Calculated) : 52.4 HEPARIN DW (KG): 69  Vital Signs: Temp: 98.4 F (36.9 C) (09/04 0743) Temp Source: Oral (09/04 0743) BP: 94/47 (09/04 0743) Pulse Rate: 63 (09/04 0743)  Labs: Recent Labs    12/07/18 1742 12/07/18 1920 12/07/18 2010 12/08/18 0421 12/08/18 0834  HGB 12.2  --   --  11.7*  --   HCT 39.3  --   --  37.9  --   PLT 291  --   --  258  --   APTT 30  --   --   --   --   LABPROT 12.9  --   --   --   --   INR 1.0  --   --   --   --   HEPARINUNFRC  --   --   --   --  <0.10*  CREATININE  --  0.57  --  0.61  --   TROPONINIHS  --  17 24*  --   --    Estimated Creatinine Clearance: 108.8 mL/min (by C-G formula based on SCr of 0.61 mg/dL).  Medical History: Past Medical History:  Diagnosis Date  . Coronary artery disease   . Hyperlipidemia    previously diagnosed; not currently being treated  . Hypertension    previous hx; not currently symptomatic  . MI (myocardial infarction) ([place])    Medications:  No medications prior to admission.    Assessment: Pharmacy asked to initiate Heparin for ACS.   9/4 HL@ 0834: < 0.10. Subtherapeutic. Called nurse to verify if heparin had been interrupted and was told that the patient went down for a stress test ~0830 and the heparin has been stopped for that.    Goal of Therapy:  Heparin level 0.3-0.7 units/ml Monitor platelets by anticoagulation protocol: Yes   Plan:  Will follow up on heparin once patient returns.   Update: heparin d/c'd per Dr. Clayborn Bigness.   Rowland Lathe 12/08/2018,9:26 AM

## 2018-12-08 NOTE — Discharge Summary (Addendum)
[facility] [place] [facility]   PATIENT NAME: Carrie Winters    MR#:  [id number]  DATE OF BIRTH:  10-27-1970  DATE OF ADMISSION:  12/07/2018 ADMITTING PHYSICIAN: Oralia Manisavid Willis, MD  DATE OF DISCHARGE: 12/08/2018  PRIMARY CARE PHYSICIAN: Patient, No Pcp Per    ADMISSION DIAGNOSIS:  Chest pain, unspecified type [R07.9]  DISCHARGE DIAGNOSIS:  Principal Problem:   Chest pain Active Problems:   CAD (coronary artery disease)   HTN (hypertension)   HLD (hyperlipidemia)   SECONDARY DIAGNOSIS:   Past Medical History:  Diagnosis Date  . Coronary artery disease   . Hyperlipidemia    previously diagnosed; not currently being treated  . Hypertension    previous hx; not currently symptomatic  . MI (myocardial infarction) [facility](HCC)     HOSPITAL COURSE:   48 year old female with remote history of CAD presented with atypical chest pain.  1.  Atypical chest pain: Patient ruled out for ACS.  Patient underwent cardiac stress test.   Her stress test was negative for acute ischemia. Patient will have outpatient follow-up with cardiology.    DISCHARGE CONDITIONS AND DIET:  Stable Cardiac diet  CONSULTS OBTAINED:    DRUG ALLERGIES:  No Known Allergies  DISCHARGE MEDICATIONS:   Allergies as of 12/08/2018   No Known Allergies     Medication List    You have not been prescribed any medications.       Today   CHIEF COMPLAINT:  No CP  Doing ok hungry   VITAL SIGNS:  Blood pressure 108/76, pulse 72, temperature 98.4 F (36.9 C), temperature source Oral, resp. rate 20, height 5\' 3"  (1.6 m), weight 117.5 kg, SpO2 100 %.   REVIEW OF SYSTEMS:  Review of Systems  Constitutional: Negative.  Negative for chills, fever and malaise/fatigue.  HENT: Negative.  Negative for ear discharge, ear pain, hearing loss, nosebleeds and sore throat.   Eyes: Negative.  Negative for blurred vision and pain.  Respiratory: Negative.  Negative for cough, hemoptysis,  shortness of breath and wheezing.   Cardiovascular: Negative.  Negative for chest pain, palpitations and leg swelling.  Gastrointestinal: Negative.  Negative for abdominal pain, blood in stool, diarrhea, nausea and vomiting.  Genitourinary: Negative.  Negative for dysuria.  Musculoskeletal: Negative.  Negative for back pain.  Skin: Negative.   Neurological: Negative for dizziness, tremors, speech change, focal weakness, seizures and headaches.  Endo/Heme/Allergies: Negative.  Does not bruise/bleed easily.  Psychiatric/Behavioral: Negative.  Negative for depression, hallucinations and suicidal ideas.     PHYSICAL EXAMINATION:  GENERAL:  48 y.o.-year-old patient lying in the bed with no acute distress.  NECK:  Supple, no jugular venous distention. No thyroid enlargement, no tenderness.  LUNGS: Normal breath sounds bilaterally, no wheezing, rales,rhonchi  No use of accessory muscles of respiration.  CARDIOVASCULAR: S1, S2 normal. No murmurs, rubs, or gallops.  ABDOMEN: Soft, non-tender, non-distended. Bowel sounds present. No organomegaly or mass.  EXTREMITIES: No pedal edema, cyanosis, or clubbing.  PSYCHIATRIC: The patient is alert and oriented x 3.  SKIN: No obvious rash, lesion, or ulcer.   DATA REVIEW:   CBC Recent Labs  Lab 12/08/18 0421  WBC 9.7  HGB 11.7*  HCT 37.9  PLT 258    Chemistries  Recent Labs  Lab 12/07/18 2010 12/08/18 0421  NA  --  140  K  --  3.5  CL  --  108  CO2  --  25  GLUCOSE  --  99  BUN  --  14  CREATININE  --  0.61  CALCIUM  --  8.8*  AST 19  --   ALT 23  --   ALKPHOS 54  --   BILITOT 0.1*  --     Cardiac Enzymes No results for input(s): TROPONINI in the last 168 hours.  Microbiology Results  @[email or web address] @  RADIOLOGY:  Dg Chest 2 View  Result Date: 12/07/2018 CLINICAL DATA:  Chest pain EXAM: CHEST - 2 VIEW COMPARISON:  12/10/2016 FINDINGS: The heart size and mediastinal contours are within normal limits. Both lungs are clear. The  visualized skeletal structures are unremarkable. IMPRESSION: No active cardiopulmonary disease. Electronically Signed   By: Donavan Foil M.D.   On: 12/07/2018 19:00      Allergies as of 12/08/2018   No Known Allergies     Medication List    You have not been prescribed any medications.        Management plans discussed with the patient and she is in agreement. Stable for discharge   Patient should follow up with cardiology  CODE STATUS:     Code Status Orders  (From admission, onward)         Start     Ordered   12/08/18 0154  Full code  Continuous     12/08/18 0153        Code Status History    Date Active Date Inactive Code Status Order ID Comments User Context   05/04/2015 0432 05/04/2015 1938 Full Code [id number]  Quintella Baton, MD Inpatient   Advance Care Planning Activity      TOTAL TIME TAKING CARE OF THIS PATIENT: 38 minutes.    Note: This dictation was prepared with Dragon dictation along with smaller phrase technology. Any transcriptional errors that result from this process are unintentional.  Bettey Costa M.D on 12/08/2018 at 11:57 AM  Between 7am to 6pm - Pager - [phone] After 6pm go to [email or web address] - password [email or web address] [facility]  Office  [phone]  CC: Primary care physician; Patient, No Pcp Per

## 2018-12-08 NOTE — Progress Notes (Signed)
ANTICOAGULATION CONSULT NOTE - Initial Consult  Pharmacy Consult for Heparin Indication: chest pain/ACS  No Known Allergies  Patient Measurements: Height: 5\' 3"  (160 cm) Weight: 170 lb (77.1 kg) IBW/kg (Calculated) : 52.4 HEPARIN DW (KG): 69  Vital Signs: Temp: 98.2 F (36.8 C) (09/03 1740) Temp Source: Oral (09/03 1740) BP: 126/59 (09/04 0000) Pulse Rate: 61 (09/04 0000)  Labs: Recent Labs    12/07/18 1742 12/07/18 1920 12/07/18 2010  HGB 12.2  --   --   HCT 39.3  --   --   PLT 291  --   --   APTT 30  --   --   LABPROT 12.9  --   --   INR 1.0  --   --   CREATININE  --  0.57  --   TROPONINIHS  --  17 24*   Estimated Creatinine Clearance: 86.4 mL/min (by C-G formula based on SCr of 0.57 mg/dL).  Medical History: Past Medical History:  Diagnosis Date  . Coronary artery disease   . Hyperlipidemia    previously diagnosed; not currently being treated  . Hypertension    previous hx; not currently symptomatic  . MI (myocardial infarction) ([place])    Medications:  (Not in a hospital admission)   Assessment: Pharmacy asked to initiate Heparin for ACS.  Baseline labs normal.  Goal of Therapy:  Heparin level 0.3-0.7 units/ml Monitor platelets by anticoagulation protocol: Yes   Plan:  Heparin 4000 units bolus x 1 Heparin infusion at 800 units/hr Check heparin level 6 hours after infusion started  Hart Robinsons A 12/08/2018,1:17 AM

## 2018-12-08 NOTE — ED Notes (Signed)
ED TO INPATIENT HANDOFF REPORT  ED Nurse Name and Phone #: Orpha BurKaty [phone]  S Name/Age/Gender Carrie RossettiJerrie V Winters 48 y.o. female Room/Bed: ED08A/ED08A  Code Status   Code Status: Prior  Home/SNF/Other Home Patient oriented to: self, place, time and situation Is this baseline? Yes   Triage Complete: Triage complete  Chief Complaint weakness  Triage Note Pt brought in via ems with chest pain,.   No pain on arrival to ER.  No sob now.  Pt alert. Iv in place.  324 asa given by ems     Allergies No Known Allergies  Level of Care/Admitting Diagnosis ED Disposition    ED Disposition Condition Comment   Admit  Hospital Area: [facility] [[id number]]  Level of Care: Telemetry [5]  Covid Evaluation: Confirmed COVID Negative  Diagnosis: Chest pain [[id number]][[id number]]  Admitting Physician: Carrie ManisWILLIS, DAVID [[id number]]  Attending Physician: Carrie ManisWILLIS, DAVID [[id number]]  Bed request comments: 2a  PT Class (Do Not Modify): Observation [104]  PT Acc Code (Do Not Modify): Observation [10022]       B Medical/Surgery History Past Medical History:  Diagnosis Date  . Coronary artery disease   . Hyperlipidemia    previously diagnosed; not currently being treated  . Hypertension    previous hx; not currently symptomatic  . MI (myocardial infarction) Carrie Winters(HCC)    Past Surgical History:  Procedure Laterality Date  . CESAREAN SECTION    . JOINT REPLACEMENT     rotator cuff, left arm     A IV Location/Drains/Wounds Patient Lines/Drains/Airways Status   Active Line/Drains/Airways    Name:   Placement date:   Placement time:   Site:   Days:   Peripheral IV 05/04/15 Left Antecubital   05/04/15    0048    Antecubital   1314          Intake/Output Last 24 hours No intake or output data in the 24 hours ending 12/08/18 0115  Labs/Imaging Results for orders placed or performed during the hospital encounter of 12/07/18 (from the past 48 hour(s))  CBC     Status: Abnormal   Collection  Time: 12/07/18  5:42 PM  Result Value Ref Range   WBC 12.7 (H) 4.0 - 10.5 K/uL   RBC 4.98 3.87 - 5.11 MIL/uL   Hemoglobin 12.2 12.0 - 15.0 g/dL   HCT 08.639.3 57.836.0 - 46.946.0 %   MCV 78.9 (L) 80.0 - 100.0 fL   MCH 24.5 (L) 26.0 - 34.0 pg   MCHC 31.0 30.0 - 36.0 g/dL   RDW 62.922.6 (H) 52.811.5 - 41.315.5 %   Platelets 291 150 - 400 K/uL   nRBC 0.0 0.0 - 0.2 %    Comment: Performed at [facility], [facility], [address] [place], [place] [address]  APTT     Status: None   Collection Time: 12/07/18  5:42 PM  Result Value Ref Range   aPTT 30 24 - 36 seconds    Comment: Performed at [facility], [address] [place], [place] [address]  Protime-INR     Status: None   Collection Time: 12/07/18  5:42 PM  Result Value Ref Range   Prothrombin Time 12.9 11.4 - 15.2 seconds   INR 1.0 0.8 - 1.2    Comment: (NOTE) INR goal varies based on device and disease states. Performed at [facility], [address] [place], [place] [address]   Basic metabolic panel     Status: Abnormal   Collection Time: 12/07/18  7:20  PM  Result Value Ref Range   Sodium 139 135 - 145 mmol/L   Potassium 4.0 3.5 - 5.1 mmol/L   Chloride 106 98 - 111 mmol/L   CO2 24 22 - 32 mmol/L   Glucose, Bld 106 (H) 70 - 99 mg/dL   BUN 13 6 - 20 mg/dL   Creatinine, Ser 0.57 0.44 - 1.00 mg/dL   Calcium 9.3 8.9 - 10.3 mg/dL   GFR calc non Af Amer >60 >60 mL/min   GFR calc Af Amer >60 >60 mL/min   Anion gap 9 5 - 15    Comment: Performed at [facility], [place], [place] [address]  Troponin I (High Sensitivity)     Status: None   Collection Time: 12/07/18  7:20 PM  Result Value Ref Range   Troponin I (High Sensitivity) 17 <18 ng/L    Comment: (NOTE) Elevated high sensitivity troponin I (hsTnI) values and significant  changes across serial measurements may suggest ACS but many other  chronic and acute conditions are known to elevate hsTnI results.  Refer to the "Links" section for chest  pain algorithms and additional  guidance. Performed at [facility], [place], [place] [address]   Troponin I (High Sensitivity)     Status: Abnormal   Collection Time: 12/07/18  8:10 PM  Result Value Ref Range   Troponin I (High Sensitivity) 24 (H) <18 ng/L    Comment: (NOTE) Elevated high sensitivity troponin I (hsTnI) values and significant  changes across serial measurements may suggest ACS but many other  chronic and acute conditions are known to elevate hsTnI results.  Refer to the "Links" section for chest pain algorithms and additional  guidance. Performed at [facility], [address] [place], [place] [address]   Hepatic function panel     Status: Abnormal   Collection Time: 12/07/18  8:10 PM  Result Value Ref Range   Total Protein 6.0 (L) 6.5 - 8.1 g/dL   Albumin 3.0 (L) 3.5 - 5.0 g/dL   AST 19 15 - 41 U/L   ALT 23 0 - 44 U/L   Alkaline Phosphatase 54 38 - 126 U/L   Total Bilirubin 0.1 (L) 0.3 - 1.2 mg/dL   Bilirubin, Direct <0.1 0.0 - 0.2 mg/dL   Indirect Bilirubin NOT CALCULATED 0.3 - 0.9 mg/dL    Comment: Performed at [facility], [address] [place],  [address]  SARS Coronavirus 2 [facility] order, Performed in [facility] hospital lab) Nasopharyngeal Nasopharyngeal Swab     Status: None   Collection Time: 12/07/18  9:08 PM   Specimen: Nasopharyngeal Swab  Result Value Ref Range   SARS Coronavirus 2 NEGATIVE NEGATIVE    Comment: (NOTE) If result is NEGATIVE SARS-CoV-2 target nucleic acids are NOT DETECTED. The SARS-CoV-2 RNA is generally detectable in upper and lower  respiratory specimens during the acute phase of infection. The lowest  concentration of SARS-CoV-2 viral copies this assay can detect is 250  copies / mL. A negative result does not preclude SARS-CoV-2 infection  and should not be used as the sole basis for treatment or other  patient management decisions.  A negative result may occur  with  improper specimen collection / handling, submission of specimen other  than nasopharyngeal swab, presence of viral mutation(s) within the  areas targeted by this assay, and inadequate number of viral copies  (<250 copies / mL). A negative result must be combined with clinical  observations, patient history, and epidemiological information. If result is POSITIVE SARS-CoV-2 target nucleic acids are DETECTED. The SARS-CoV-2 RNA is generally detectable in upper and lower  respiratory specimens dur ing the acute phase of infection.  Positive  results are indicative of active infection with SARS-CoV-2.  Clinical  correlation with patient history and other diagnostic information is  necessary to determine patient infection status.  Positive results do  not rule out bacterial infection or co-infection with other viruses. If result is PRESUMPTIVE POSTIVE SARS-CoV-2 nucleic acids MAY BE PRESENT.   A presumptive positive result was obtained on the submitted specimen  and confirmed on repeat testing.  While 2019 novel coronavirus  (SARS-CoV-2) nucleic acids may be present in the submitted sample  additional confirmatory testing may be necessary for epidemiological  and / or clinical management purposes  to differentiate between  SARS-CoV-2 and other Sarbecovirus currently known to infect humans.  If clinically indicated additional testing with an alternate test  methodology [phone]) is advised. The SARS-CoV-2 RNA is generally  detectable in upper and lower respiratory sp ecimens during the acute  phase of infection. The expected result is Negative. Fact Sheet for Patients:  BoilerBrush.com.cy Fact Sheet for Healthcare Providers: https://pope.com/ This test is not yet approved or cleared by the [place] FDA and has been authorized for detection and/or diagnosis of SARS-CoV-2 by FDA under an Emergency Use Authorization (EUA).  This EUA  will remain in effect (meaning this test can be used) for the duration of the COVID-19 declaration under Section 564(b)(1) of the Act, 21 U.S.C. section 360bbb-3(b)(1), unless the authorization is terminated or revoked sooner. Performed at [facility], [address] [place], [place] [address]    Dg Chest 2 View  Result Date: 12/07/2018 CLINICAL DATA:  Chest pain EXAM: CHEST - 2 VIEW COMPARISON:  12/10/2016 FINDINGS: The heart size and mediastinal contours are within normal limits. Both lungs are clear. The visualized skeletal structures are unremarkable. IMPRESSION: No active cardiopulmonary disease. Electronically Signed   By: Jasmine Pang M.D.   On: 12/07/2018 19:00    Pending Labs [employer] (From admission, onward)    Start     Ordered   Signed and Held  HIV antibody (Routine Testing)  Once,   R     Signed and Held   Signed and Armed forces training and education officer morning,   R     Signed and Held   Signed and Held  CBC  Tomorrow morning,   R     Signed and Held          Vitals/Pain Today's Vitals   12/07/18 2238 12/07/18 2320 12/08/18 0000 12/08/18 0113  BP: 114/73  (!) 126/59   Pulse: 87  61   Resp: 14  17   Temp:      TempSrc:      SpO2: 96%  98%   Weight:    77.1 kg  Height:    5\' 3"  (1.6 m)  PainSc:  1       Isolation Precautions No active isolations  Medications Medications  sodium chloride flush (NS) 0.9 % injection 3 mL (has no administration in time range)  morphine 4 MG/ML injection 4 mg (has no administration in time range)  heparin bolus via infusion 4,000 Units (has no administration in time range)    Followed by  heparin ADULT infusion 100 units/mL (25000 units/22mL sodium chloride 0.45%) (has no administration in time range)  alum & mag hydroxide-simeth (  MAALOX/MYLANTA) 200-200-20 MG/5ML suspension 30 mL (30 mLs Oral Given 12/07/18 2006)    And  lidocaine (XYLOCAINE) 2 % viscous mouth solution 15 mL (15 mLs Oral Given 12/07/18  2006)  ibuprofen (ADVIL) tablet 600 mg (600 mg Oral Given 12/07/18 2000)  aspirin chewable tablet 324 mg (324 mg Oral Given 12/07/18 2315)  acetaminophen (TYLENOL) tablet 1,000 mg (1,000 mg Oral Given 12/07/18 2345)    Mobility walks Low fall risk   Focused Assessments Cardiac Assessment Handoff:    Lab Results  Component Value Date   TROPONINI <0.03 12/10/2016   No results found for: DDIMER Does the Patient currently have chest pain? No     R Recommendations: See Admitting Provider Note  Report given to:   Additional Notes:

## 2018-12-09 LAB — HIV ANTIBODY (ROUTINE TESTING W REFLEX): HIV Screen 4th Generation wRfx: NONREACTIVE

## 2018-12-10 LAB — ECHOCARDIOGRAM COMPLETE
Height: 63 in
Weight: 4144 oz
